# Patient Record
Sex: Male | Born: 2000 | Race: Black or African American | Hispanic: No | Marital: Single | Smoking: Current some day smoker
Health system: Southern US, Community
[De-identification: ages and names within clinical notes are randomized; demographics above are authoritative.]

---
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
## 2019-12-02 ENCOUNTER — Other Ambulatory Visit: Payer: Self-pay

## 2019-12-02 DIAGNOSIS — Z5321 Procedure and treatment not carried out due to patient leaving prior to being seen by health care provider: Secondary | ICD-10-CM | POA: Diagnosis not present

## 2019-12-02 DIAGNOSIS — G43909 Migraine, unspecified, not intractable, without status migrainosus: Secondary | ICD-10-CM | POA: Diagnosis present

## 2019-12-02 NOTE — ED Triage Notes (Addendum)
Patient complaining of migraine. Patient brought in by [facility] from home. Patient has no hx of migraines. Has had nausea no vomiting. Patient is ambulatory. Patient states he took ibuprofen around 9 pm.

## 2019-12-03 ENCOUNTER — Encounter (HOSPITAL_COMMUNITY): Payer: Self-pay | Admitting: Emergency Medicine

## 2019-12-03 ENCOUNTER — Emergency Department (HOSPITAL_COMMUNITY)
Admission: EM | Admit: 2019-12-03 | Discharge: 2019-12-03 | Discharge status: Against Medical Advice | Payer: Medicaid Other | Attending: Emergency Medicine | Admitting: Emergency Medicine

## 2019-12-05 ENCOUNTER — Other Ambulatory Visit: Payer: Self-pay

## 2019-12-05 ENCOUNTER — Emergency Department (HOSPITAL_COMMUNITY)
Admission: EM | Admit: 2019-12-05 | Discharge: 2019-12-05 | Disposition: A | Discharge status: Home / Self Care | Payer: Medicaid Other | Attending: Emergency Medicine | Admitting: Emergency Medicine

## 2019-12-05 ENCOUNTER — Ambulatory Visit (HOSPITAL_COMMUNITY)
Admission: EM | Admit: 2019-12-05 | Discharge: 2019-12-05 | Disposition: A | Discharge status: Home / Self Care | Payer: Self-pay

## 2019-12-05 ENCOUNTER — Encounter (HOSPITAL_COMMUNITY): Payer: Self-pay

## 2019-12-05 ENCOUNTER — Emergency Department (HOSPITAL_COMMUNITY): Payer: Medicaid Other

## 2019-12-05 DIAGNOSIS — Z7722 Contact with and (suspected) exposure to environmental tobacco smoke (acute) (chronic): Secondary | ICD-10-CM | POA: Insufficient documentation

## 2019-12-05 DIAGNOSIS — R519 Headache, unspecified: Secondary | ICD-10-CM | POA: Insufficient documentation

## 2019-12-05 DIAGNOSIS — R42 Dizziness and giddiness: Secondary | ICD-10-CM | POA: Diagnosis not present

## 2019-12-05 MED ORDER — ACETAMINOPHEN 500 MG PO TABS
1000.0000 mg | ORAL_TABLET | Freq: Once | ORAL | Status: AC
  Administered 2019-12-05: 1000 mg via ORAL
  Filled 2019-12-05: qty 2

## 2019-12-05 NOTE — ED Provider Notes (Signed)
[facility] EMERGENCY DEPARTMENT Provider Note   CSN: [id number] Arrival date & time: 12/05/19  1018     History No chief complaint on file.   Tony Vargas is a 18 y.o. male who is previously healthy who presents with a 6-day history of headache.  Patient reports he started with a normal type headache 6 days ago and since then he has had intermittent episodes of severe headaches that come on suddenly.  He reports most the time when he bends over.  He has had some dizziness at times that he describes as room spinning.  He is also had some photophobia.  He denies any nausea or vomiting, numbness or tingling, or weakness.  He is taking ibuprofen at home once.  He denies any neck pain or fever.  HPI     History reviewed. No pertinent past medical history.  There are no problems to display for this patient.   History reviewed. No pertinent surgical history.     No family history on file.  Social History   Tobacco Use  . Smoking status: Passive Smoke Exposure - Never Smoker  . Smokeless tobacco: Never Used  Substance Use Topics  . Alcohol use: Not Currently  . Drug use: Not Currently    Home Medications Prior to Admission medications   Medication Sig Start Date End Date Taking? Authorizing Provider  ibuprofen (ADVIL,MOTRIN) 600 MG tablet Take 1 tablet (600 mg total) by mouth every 6 (six) hours as needed for mild pain or moderate pain. 05/06/17   Benjamine Sprague, NP    Allergies    Grapeseed extract [nutritional supplements]  Review of Systems   Review of Systems  Constitutional: Negative for chills and fever.  HENT: Negative for facial swelling and sore throat.   Eyes: Positive for photophobia. Negative for visual disturbance.  Respiratory: Negative for shortness of breath.   Cardiovascular: Negative for chest pain.  Gastrointestinal: Negative for abdominal pain, diarrhea, nausea and vomiting.  Genitourinary: Negative for dysuria.   Musculoskeletal: Negative for back pain and neck pain.  Skin: Negative for rash and wound.  Neurological: Positive for dizziness and headaches. Negative for weakness and numbness.  Psychiatric/Behavioral: The patient is not nervous/anxious.     Physical Exam Updated Vital Signs BP (!) 146/90 (BP Location: Right Arm)   Pulse 90   Temp 97.8 F (36.6 C) (Oral)   Resp 20   SpO2 99%   Physical Exam Vitals and nursing note reviewed.  Constitutional:      General: He is not in acute distress.    Appearance: He is well-developed. He is not diaphoretic.  HENT:     Head: Normocephalic and atraumatic.     Mouth/Throat:     Pharynx: No oropharyngeal exudate.  Eyes:     General: No scleral icterus.       Right eye: No discharge.        Left eye: No discharge.     Extraocular Movements: Extraocular movements intact.     Conjunctiva/sclera: Conjunctivae normal.     Pupils: Pupils are equal, round, and reactive to light.  Neck:     Thyroid: No thyromegaly.  Cardiovascular:     Rate and Rhythm: Normal rate and regular rhythm.     Heart sounds: Normal heart sounds. No murmur. No friction rub. No gallop.   Pulmonary:     Effort: Pulmonary effort is normal. No respiratory distress.     Breath sounds: Normal breath sounds. No stridor. No  wheezing or rales.  Abdominal:     General: Bowel sounds are normal. There is no distension.     Palpations: Abdomen is soft.     Tenderness: There is no abdominal tenderness. There is no guarding or rebound.  Musculoskeletal:     Cervical back: Full passive range of motion without pain, normal range of motion and neck supple. No spinous process tenderness or muscular tenderness.  Lymphadenopathy:     Cervical: No cervical adenopathy.  Skin:    General: Skin is warm and dry.     Coloration: Skin is not pale.     Findings: No rash.  Neurological:     Mental Status: He is alert.     Coordination: Coordination normal.     Comments: CN 3-12 intact;  normal sensation throughout; 5/5 strength in all 4 extremities; equal bilateral grip strength     ED Results / Procedures / Treatments   Labs (all labs ordered are listed, but only abnormal results are displayed) Labs Reviewed - No data to display  EKG None  Radiology CT Head Wo Contrast  Result Date: 12/05/2019 CLINICAL DATA:  Per pt and mother, pt c/o "severe HA" and had syncopal episode. Pt alert, appropriate in conversation, ambulating without difficulty. Pt seen at [facility] on 12/09 for the same EXAM: CT HEAD WITHOUT CONTRAST TECHNIQUE: Contiguous axial images were obtained from the base of the skull through the vertex without intravenous contrast. COMPARISON:  None. FINDINGS: Brain: No evidence of acute infarction, hemorrhage, hydrocephalus, extra-axial collection or mass lesion/mass effect. Vascular: No hyperdense vessel or unexpected calcification. Skull: Normal. Negative for fracture or focal lesion. Sinuses/Orbits: The right frontal sinus is opacified. The remaining sinuses are clear. Normal appearance of the orbits. Other: None. IMPRESSION: 1. No acute intracranial abnormality. 2. Right frontal sinus disease. Electronically Signed   By: Emmaline Kluver M.D.   On: 12/05/2019 12:32    Procedures Procedures (including critical care time)  Medications Ordered in ED Medications  acetaminophen (TYLENOL) tablet 1,000 mg (1,000 mg Oral Given 12/05/19 1256)    ED Course  I have reviewed the triage vital signs and the nursing notes.  Pertinent labs & imaging results that were available during my care of the patient were reviewed by me and considered in my medical decision making (see chart for details).    MDM Rules/Calculators/A&P  Pt HA treated and improved while in ED. given new headache and reports of sudden headache, CT head was ordered which was negative except for right frontal sinus disease.  Patient denies any history of sinus problems.  He denies pain in this area.  Pt is  afebrile with no focal neuro deficits, nuchal rigidity, or change in vision.  Patient is very comfortable appearing and denies any severe headache right now.  Pt is to follow up with PCP or neurology for further evaluation. Pt verbalizes understanding and is agreeable with plan to dc. I discussed patient case with Dr. Denton Lank who guided the patient's management and agrees with plan.    Final Clinical Impression(s) / ED Diagnoses Final diagnoses:  Bad headache    Rx / DC Orders ED Discharge Orders    None       Emi Holes, PA-C 12/05/19 1300    Cathren Laine, MD 12/06/19 [phone]

## 2019-12-05 NOTE — ED Triage Notes (Signed)
Per pt and mother, pt c/o "severe HA" and had syncopal episode.  Pt alert, appropriate in conversation, ambulating without difficulty.  Discussed with Rondel Oh, NP.  Instructed pt and mother to be seen in ED based on sxs per provider recommendation.  Pt and mother verbalized understanding.

## 2019-12-05 NOTE — Discharge Instructions (Signed)
Alternate ibuprofen and Tylenol as prescribed over-the-counter, as needed for headache.  Make sure to stay well-hydrated.  Try to avoid excessive caffeine.  Make sure to get plenty of sleep.  Please follow-up with your primary care provider or neurologist below for further evaluation and treatment of your headaches.  I recommend beginning a headache journal.

## 2019-12-05 NOTE — ED Triage Notes (Signed)
Patient complains of generalized headache for the past week. Denies any trauma, no associated symptoms. Alert and oriented

## 2019-12-25 ENCOUNTER — Emergency Department (HOSPITAL_COMMUNITY): Payer: Medicaid Other

## 2019-12-25 ENCOUNTER — Emergency Department (HOSPITAL_COMMUNITY)
Admission: EM | Admit: 2019-12-25 | Discharge: 2019-12-25 | Disposition: A | Discharge status: Home / Self Care | Payer: Medicaid Other | Attending: Emergency Medicine | Admitting: Emergency Medicine

## 2019-12-25 DIAGNOSIS — Z20822 Contact with and (suspected) exposure to covid-19: Secondary | ICD-10-CM | POA: Diagnosis not present

## 2019-12-25 DIAGNOSIS — R0789 Other chest pain: Secondary | ICD-10-CM | POA: Diagnosis not present

## 2019-12-25 DIAGNOSIS — R519 Headache, unspecified: Secondary | ICD-10-CM | POA: Insufficient documentation

## 2019-12-25 DIAGNOSIS — Z7722 Contact with and (suspected) exposure to environmental tobacco smoke (acute) (chronic): Secondary | ICD-10-CM | POA: Diagnosis not present

## 2019-12-25 DIAGNOSIS — Z79899 Other long term (current) drug therapy: Secondary | ICD-10-CM | POA: Insufficient documentation

## 2019-12-25 DIAGNOSIS — R1032 Left lower quadrant pain: Secondary | ICD-10-CM | POA: Diagnosis not present

## 2019-12-25 DIAGNOSIS — R109 Unspecified abdominal pain: Secondary | ICD-10-CM

## 2019-12-25 DIAGNOSIS — R935 Abnormal findings on diagnostic imaging of other abdominal regions, including retroperitoneum: Secondary | ICD-10-CM | POA: Diagnosis not present

## 2019-12-25 DIAGNOSIS — R1012 Left upper quadrant pain: Secondary | ICD-10-CM | POA: Diagnosis not present

## 2019-12-25 DIAGNOSIS — R0781 Pleurodynia: Secondary | ICD-10-CM | POA: Diagnosis not present

## 2019-12-25 LAB — URINALYSIS, ROUTINE W REFLEX MICROSCOPIC
Glucose, UA: NEGATIVE mg/dL
Hgb urine dipstick: NEGATIVE
Ketones, ur: 20 mg/dL — AB
Leukocytes,Ua: NEGATIVE
Nitrite: NEGATIVE
Protein, ur: 100 mg/dL — AB
Specific Gravity, Urine: 1.038 — ABNORMAL HIGH (ref 1.005–1.030)
pH: 6 (ref 5.0–8.0)

## 2019-12-25 LAB — COMPREHENSIVE METABOLIC PANEL
ALT: 27 U/L (ref 0–44)
AST: 37 U/L (ref 15–41)
Albumin: 4.1 g/dL (ref 3.5–5.0)
Alkaline Phosphatase: 70 U/L (ref 38–126)
Anion gap: 10 (ref 5–15)
BUN: 10 mg/dL (ref 6–20)
CO2: 26 mmol/L (ref 22–32)
Calcium: 8.9 mg/dL (ref 8.9–10.3)
Chloride: 104 mmol/L (ref 98–111)
Creatinine, Ser: 1.02 mg/dL (ref 0.61–1.24)
GFR calc Af Amer: >60 mL/min (ref >60)
GFR calc non Af Amer: >60 mL/min (ref >60)
Glucose, Bld: 86 mg/dL (ref 70–99)
Potassium: 4 mmol/L (ref 3.5–5.1)
Sodium: 140 mmol/L (ref 135–145)
Total Bilirubin: 1.9 mg/dL — ABNORMAL HIGH (ref 0.3–1.2)
Total Protein: 7.3 g/dL (ref 6.5–8.1)

## 2019-12-25 LAB — CBC WITH DIFFERENTIAL/PLATELET
Abs Immature Granulocytes: 0 10*3/uL (ref 0.00–0.07)
Basophils Absolute: 0 10*3/uL (ref 0.0–0.1)
Basophils Relative: 0 %
Eosinophils Absolute: 0 10*3/uL (ref 0.0–0.5)
Eosinophils Relative: 0 %
HCT: 48.8 % (ref 39.0–52.0)
Hemoglobin: 16.3 g/dL (ref 13.0–17.0)
Lymphocytes Relative: 52 %
Lymphs Abs: 3.1 10*3/uL (ref 0.7–4.0)
MCH: 28.5 pg (ref 26.0–34.0)
MCHC: 33.4 g/dL (ref 30.0–36.0)
MCV: 85.3 fL (ref 80.0–100.0)
Monocytes Absolute: 0.7 10*3/uL (ref 0.1–1.0)
Monocytes Relative: 11 %
Neutro Abs: 2.2 10*3/uL (ref 1.7–7.7)
Neutrophils Relative %: 37 %
Platelets: 141 10*3/uL — ABNORMAL LOW (ref 150–400)
RBC: 5.72 MIL/uL (ref 4.22–5.81)
RDW: 12.5 % (ref 11.5–15.5)
WBC: 6 10*3/uL (ref 4.0–10.5)
nRBC: 0 % (ref 0.0–0.2)

## 2019-12-25 LAB — D-DIMER, QUANTITATIVE: D-Dimer, Quant: 0.41 ug/mL-FEU (ref 0.00–0.50)

## 2019-12-25 LAB — LIPASE, BLOOD: Lipase: 20 U/L (ref 11–51)

## 2019-12-25 LAB — POC SARS CORONAVIRUS 2 AG -  ED: SARS Coronavirus 2 Ag: NEGATIVE

## 2019-12-25 MED ORDER — SODIUM CHLORIDE 0.9 % IV BOLUS
1000.0000 mL | Freq: Once | INTRAVENOUS | Status: AC
  Administered 2019-12-25: 1000 mL via INTRAVENOUS

## 2019-12-25 MED ORDER — NAPROXEN 500 MG PO TABS: 500.0000 mg | ORAL_TABLET | Freq: Two times a day (BID) | ORAL | 0 refills | Status: AC

## 2019-12-25 MED ORDER — MORPHINE SULFATE (PF) 4 MG/ML IV SOLN
4.0000 mg | Freq: Once | INTRAVENOUS | Status: AC
  Administered 2019-12-25: 4 mg via INTRAVENOUS
  Filled 2019-12-25: qty 1

## 2019-12-25 MED ORDER — KETOROLAC TROMETHAMINE 30 MG/ML IJ SOLN
30.0000 mg | Freq: Once | INTRAMUSCULAR | Status: AC
  Administered 2019-12-25: 30 mg via INTRAVENOUS
  Filled 2019-12-25: qty 1

## 2019-12-25 MED ORDER — IOHEXOL 300 MG/ML  SOLN
100.0000 mL | Freq: Once | INTRAMUSCULAR | Status: AC | PRN
  Administered 2019-12-25: 100 mL via INTRAVENOUS

## 2019-12-25 MED ORDER — ACETAMINOPHEN 500 MG PO TABS
1000.0000 mg | ORAL_TABLET | Freq: Once | ORAL | Status: AC
  Administered 2019-12-25: 1000 mg via ORAL
  Filled 2019-12-25: qty 2

## 2019-12-25 NOTE — ED Notes (Signed)
Pt made aware of the need for urine.

## 2019-12-25 NOTE — ED Provider Notes (Signed)
[facility] EMERGENCY DEPARTMENT Provider Note   CSN: [id number] Arrival date & time: 12/25/19  1234     History Chief Complaint  Patient presents with  . Headache    Tony Vargas is a 19 y.o. male.  HPI   Pt is a 19 y/o male who presents to the ED today for evaluation of multiple complaints including headache and left abd pain/left lower rib pain.  Headache: He reports intermittent headaches for the last 3 weeks. He states that this AM he woke up and had a headache. HA is located diffusely. Pain feels like an aching/throbbing sensation. When he had the headache he rated it 8-9/10. The headache lasted for 3-4 minutes. He states that typically that is how long the episodes last. He does not have a headache currently.  Denies any vision changes, lightheadedness, dizziness, numbness/weakness.  Left abdominal pain/left lower rib pain: Patient states that for the last week he has had persistent left upper abdominal pain and left lower rib pain.  Pain is worse with breathing. He denies NVD, constipation, urinary sxs. No recent surgeries or hospital admission. No h/o cvt/pe or ca. No cough or hemoptysis. Denies prior diagnosis of COVID or known COVID exposures this year.   Denies associated rhinorrhea, congestion, sore throat, cough, shortness of breath. He also reports that he had body aches a few days ago that have resolved. He reports that he has had decreased appetite for the last few days. He has not really been eating anything. Yesterday all he ate was half of a slice of pizza and today all he ate was hashbrowns.  He has tried tylenol without relief of his sxs.  He was seen in the ED earlier this month for the headache. Records reviewed. At that time pt had reassuring w/u with neg CT head.   No past medical history on file.  There are no problems to display for this patient.  No past surgical history on file.    No family history on file.  Social History    Tobacco Use  . Smoking status: Passive Smoke Exposure - Never Smoker  . Smokeless tobacco: Never Used  Substance Use Topics  . Alcohol use: Not Currently  . Drug use: Not Currently    Home Medications Prior to Admission medications   Medication Sig Start Date End Date Taking? Authorizing Provider  ibuprofen (ADVIL,MOTRIN) 600 MG tablet Take 1 tablet (600 mg total) by mouth every 6 (six) hours as needed for mild pain or moderate pain. 05/06/17   Ronnell Freshwater, NP  naproxen (NAPROSYN) 500 MG tablet Take 1 tablet (500 mg total) by mouth 2 (two) times daily for 5 days. 12/25/19 12/30/19  Lowen Mansouri S, PA-C    Allergies    Grapeseed extract [nutritional supplements]  Review of Systems   Review of Systems  Constitutional: Negative for chills and fever.  HENT: Negative for ear pain and sore throat.   Eyes: Negative for visual disturbance.  Respiratory: Negative for cough and shortness of breath.   Cardiovascular: Positive for chest pain.  Gastrointestinal: Positive for abdominal pain. Negative for constipation, diarrhea, nausea and vomiting.  Genitourinary: Negative for dysuria and hematuria.  Musculoskeletal: Positive for myalgias.  Skin: Negative for rash.  Neurological: Positive for headaches. Negative for dizziness, weakness, light-headedness and numbness.  All other systems reviewed and are negative.   Physical Exam Updated Vital Signs BP (!) 154/94 (BP Location: Right Arm)   Pulse 87   Temp 98.1 F (  36.7 C)   Resp 16   SpO2 100%   Physical Exam Vitals and nursing note reviewed.  Constitutional:      Appearance: He is well-developed.  HENT:     Head: Normocephalic and atraumatic.  Eyes:     Conjunctiva/sclera: Conjunctivae normal.  Cardiovascular:     Rate and Rhythm: Normal rate and regular rhythm.     Heart sounds: No murmur.  Pulmonary:     Effort: Pulmonary effort is normal. No respiratory distress.     Breath sounds: Normal breath sounds.   Abdominal:     General: Bowel sounds are normal. There is no distension.     Palpations: Abdomen is soft.     Tenderness: There is abdominal tenderness (mild luq). There is no guarding.  Musculoskeletal:     Cervical back: Neck supple.  Skin:    General: Skin is warm and dry.  Neurological:     Mental Status: He is alert.     Comments: Mental Status:  Alert, thought content appropriate, able to give a coherent history. Speech fluent without evidence of aphasia. Able to follow 2 step commands without difficulty.  Cranial Nerves:  II:  pupils equal, round, reactive to light III,IV, VI: ptosis not present, extra-ocular motions intact bilaterally  V,VII: smile symmetric, facial light touch sensation equal VIII: hearing grossly normal to voice  X: uvula elevates symmetrically  XI: bilateral shoulder shrug symmetric and strong XII: midline tongue extension without fassiculations Motor:  Normal tone. 5/5 strength of BUE and BLE major muscle groups including strong and equal grip strength and dorsiflexion/plantar flexion Sensory: light touch normal in all extremities.     ED Results / Procedures / Treatments   Labs (all labs ordered are listed, but only abnormal results are displayed) Labs Reviewed  CBC WITH DIFFERENTIAL/PLATELET - Abnormal; Notable for the following components:      Result Value   Platelets 141 (*)    All other components within normal limits  COMPREHENSIVE METABOLIC PANEL - Abnormal; Notable for the following components:   Total Bilirubin 1.9 (*)    All other components within normal limits  URINALYSIS, ROUTINE W REFLEX MICROSCOPIC - Abnormal; Notable for the following components:   Color, Urine AMBER (*)    Specific Gravity, Urine 1.038 (*)    Bilirubin Urine SMALL (*)    Ketones, ur 20 (*)    Protein, ur 100 (*)    Bacteria, UA RARE (*)    All other components within normal limits  NOVEL CORONAVIRUS, NAA (HOSP ORDER, SEND-OUT TO REF LAB; TAT 18-24 HRS)   LIPASE, BLOOD  D-DIMER, QUANTITATIVE (NOT AT [facility])  POC SARS CORONAVIRUS 2 AG -  ED    EKG None  Radiology CT ABDOMEN PELVIS W CONTRAST  Result Date: 12/25/2019 CLINICAL DATA:  Pt returning to ER for persistent intermittent headaches, reports no pin-point location of the headache, all over. EXAM: CT ABDOMEN AND PELVIS WITH CONTRAST TECHNIQUE: Multidetector CT imaging of the abdomen and pelvis was performed using the standard protocol following bolus administration of intravenous contrast. CONTRAST:  OMNIPAQUE IOHEXOL 300 MG/ML  SOLN COMPARISON:  None. FINDINGS: Lower chest: No acute abnormality. Hepatobiliary: No focal liver abnormality is seen. No gallstones, gallbladder wall thickening, or biliary dilatation. Pancreas: Unremarkable. No pancreatic ductal dilatation or surrounding inflammatory changes. Spleen: Normal in size without focal abnormality. Adrenals/Urinary Tract: Adrenal glands are unremarkable. Kidneys are normal, without renal calculi, focal lesion, or hydronephrosis. Bladder is unremarkable. Stomach/Bowel: Stomach is within normal limits.  Evaluation of the bowel and appendix is limited by the lack of intra-abdominal fat and PO contrast. No evidence of bowel obstruction. Vascular/Lymphatic: No significant vascular findings are present. No definite lymphadenopathy Reproductive: Prostate is unremarkable. Other: No abdominal wall hernia or abnormality. No abdominopelvic ascites. Musculoskeletal: No acute or significant osseous findings. IMPRESSION: 1. No evidence of acute intra-abdominal pathology. 2. Evaluation of the bowel and appendix is limited by the lack of intra-abdominal fat and PO contrast. However no definite abnormality is seen. Electronically Signed   By: Emmaline Kluver M.D.   On: 12/25/2019 18:11   DG Chest Portable 1 View  Result Date: 12/25/2019 CLINICAL DATA:  Left sided rib pain, no trauma EXAM: PORTABLE CHEST 1 VIEW COMPARISON:  None. FINDINGS: The heart size and  mediastinal contours are within normal limits. The lungs are clear. No pneumothorax or large pleural effusion. The visualized skeletal structures are unremarkable. No evidence of a displaced rib fracture or destructive bony lesion in visualized portions of the left ribs. IMPRESSION: No displaced rib fracture or destructive bony lesion in the visualized portions of the left ribs. Electronically Signed   By: Emmaline Kluver M.D.   On: 12/25/2019 14:03    Procedures Procedures (including critical care time)  Medications Ordered in ED Medications  acetaminophen (TYLENOL) tablet 1,000 mg (1,000 mg Oral Given 12/25/19 1418)  sodium chloride 0.9 % bolus 1,000 mL (0 mLs Intravenous Stopped 12/25/19 1732)  ketorolac (TORADOL) 30 MG/ML injection 30 mg (30 mg Intravenous Given 12/25/19 1418)  morphine 4 MG/ML injection 4 mg (4 mg Intravenous Given 12/25/19 1731)  iohexol (OMNIPAQUE) 300 MG/ML solution 100 mL (100 mLs Intravenous Contrast Given 12/25/19 1803)    ED Course  I have reviewed the triage vital signs and the nursing notes.  Pertinent labs & imaging results that were available during my care of the patient were reviewed by me and considered in my medical decision making (see chart for details).    MDM Rules/Calculators/A&P                      19 y/o male presenting with multiple complaints including persistent headaches for the last 3 weeks, left-sided abdominal pain/left lower chest pain.  With regard to headaches, patient had head CT earlier this week that was negative for any acute findings.  At this time he does not actually have a headache.  He has no associated neuro complaints and his neurologic exam today is normal.  I have low suspicion for emergent cause of his symptoms at this time and do feel that he can continue the remainder of this work-up as an outpatient.  We will give ambulatory referral to neurology.  With regard to left sided abdominal pain/left lower chest pain, D-dimer was  negative therefore PE less likely.  Chest x-ray negative.  Abdominal labs are reassuring.  No leukocytosis.  Normal LFTs and kidney function.  Normal lipase.  UA negative.  Lower suspicion for gastritis/PUD given symptoms not associated with eating and really only occur with breathing, coughing, and movement.  Will start an anti-inflammatory and have him follow-up with PCP.  He was given referral for this.  Patient was also tested for the coronavirus.  Point-of-care testing was negative.  Send out testing was obtained and pending at the time of discharge.  Patient advised on quarantine measures.  Advised on specific return precautions.  He voices understanding of the plan and reasons to return.  All questions answered.  Patient stable for  discharge.  ----  Tony Vargas was evaluated in Emergency Department on 12/25/2019 for the symptoms described in the history of present illness. He was evaluated in the context of the global COVID-19 pandemic, which necessitated consideration that the patient might be at risk for infection with the SARS-CoV-2 virus that causes COVID-19. Institutional protocols and algorithms that pertain to the evaluation of patients at risk for COVID-19 are in a state of rapid change based on information released by regulatory bodies including the CDC and federal and state organizations. These policies and algorithms were followed during the patient's care in the ED.   Final Clinical Impression(s) / ED Diagnoses Final diagnoses:  Headache disorder  Left sided abdominal pain  Atypical chest pain  Person under investigation for COVID-19    Rx / DC Orders ED Discharge Orders         Ordered    naproxen (NAPROSYN) 500 MG tablet  2 times daily     12/25/19 1841           Rodney Booze, PA-C 12/25/19 1904    Lucrezia Starch, MD 12/31/19 1457

## 2019-12-25 NOTE — ED Notes (Signed)
Patient transported to CT 

## 2019-12-25 NOTE — Discharge Instructions (Addendum)
Please take medications as directed.  You were tested for the coronavirus today.  The results will be available in the next 2 to 4 days.  Please quarantine until you are aware of the results.  If the results are positive you will need to continue quarantine for 14 days.  You were given a referral to a neurologist office.  They will contact you to set up an appointment.  You are also given information of follow-up with a primary care doctor.  Please call the office to set up an appointment for follow-up to further evaluate and address your symptoms.  Monitor symptoms closely, please return the emergency department for any new or worsening symptoms.

## 2019-12-25 NOTE — ED Notes (Signed)
Spoke to mother to update her. She would like a call from the PA when the results are in.

## 2019-12-25 NOTE — ED Triage Notes (Signed)
Pt returning to ER for persistent intermittent headaches, reports no pin-point location of the headache, all over. Denies illness- cough, nausea, diarrhea, congestion, etc. Reports was seen 3 weeks ago for same. Also reports his whole body hurts at times. NAD in triage. He is a/o x4.4

## 2019-12-25 NOTE — ED Notes (Signed)
Urine Culture sent to Main Lab with UA 

## 2019-12-26 LAB — NOVEL CORONAVIRUS, NAA (HOSP ORDER, SEND-OUT TO REF LAB; TAT 18-24 HRS): SARS-CoV-2, NAA: NOT DETECTED

## 2020-01-01 ENCOUNTER — Encounter (HOSPITAL_COMMUNITY): Payer: Self-pay

## 2020-01-01 ENCOUNTER — Ambulatory Visit (HOSPITAL_COMMUNITY)
Admission: EM | Admit: 2020-01-01 | Discharge: 2020-01-01 | Disposition: A | Discharge status: Home / Self Care | Payer: Medicaid Other | Attending: Family Medicine | Admitting: Family Medicine

## 2020-01-01 ENCOUNTER — Other Ambulatory Visit: Payer: Self-pay

## 2020-01-01 DIAGNOSIS — B2790 Infectious mononucleosis, unspecified without complication: Secondary | ICD-10-CM | POA: Diagnosis not present

## 2020-01-01 LAB — POCT INFECTIOUS MONO SCREEN: Mono Screen: POSITIVE — AB

## 2020-01-01 LAB — POCT RAPID STREP A: Streptococcus, Group A Screen (Direct): NEGATIVE

## 2020-01-01 MED ORDER — DEXAMETHASONE SODIUM PHOSPHATE 10 MG/ML IJ SOLN
10.0000 mg | Freq: Once | INTRAMUSCULAR | Status: AC
  Administered 2020-01-01: 10 mg

## 2020-01-01 MED ORDER — DEXAMETHASONE SODIUM PHOSPHATE 10 MG/ML IJ SOLN
INTRAMUSCULAR | Status: AC
  Filled 2020-01-01: qty 1

## 2020-01-01 NOTE — Discharge Instructions (Addendum)
Your mono is negative today, which is consistent with the source of your symptoms.  This is a viral illness, meaning there is no specific medication to cure it.  Push fluids to ensure adequate hydration and keep secretions thin.  Tylenol and/or ibuprofen as needed for pain or fevers.  Throat lozenges, gargles, chloraseptic spray, warm teas, popsicles etc to help with throat pain.   Most important is avoid contact sports or any risk of trauma to your abdomen such as with falls- avoid ladders, skiing etc.  Please follow up with your primary care provider for recheck in the next few weeks, especially if no improvement.  If any worsening of symptoms, specifically any increased abdominal pain, please go to the ER.  If difficulty swallowing or breathing please go to the ER.  This is contagious so avoid sharing utensils, beverages or kissing others.

## 2020-01-01 NOTE — ED Triage Notes (Addendum)
Pt. States he has had a sore throat for a week & was COVID tested on 12/25/2019, states he does NOT want COVID testing.

## 2020-01-01 NOTE — ED Provider Notes (Signed)
[facility]    CSN: [id number] Arrival date & time: 01/01/20  0915      History   Chief Complaint Chief Complaint  Patient presents with  . Sore Throat    HPI Tony Vargas is a 19 y.o. male.   Tony Vargas presents with complaints of sore throat. Headache. Started 1.5 week ago. No worse, no better. No fevers no chills. No cough, no runny nose. Has had issues with headaches for some time, has been referred to neurology for this. Has been taking naproxen for headache, doesn't help with sore throat. Theraflu, helps some with sore throat. Pain with eating. Eating soft foods. No known ill contacts. No previous mono. Was seen in ER on 1/1, negative covid testing at that time as well as negative abdominal CT. He did have complaints of LUQ pain at that time. This has not worsened. He works as a Nutritional therapist.    ROS per HPI, negative if not otherwise mentioned.      History reviewed. No pertinent past medical history.  There are no problems to display for this patient.   History reviewed. No pertinent surgical history.     Home Medications    Prior to Admission medications   Medication Sig Start Date End Date Taking? Authorizing Provider  ibuprofen (ADVIL,MOTRIN) 600 MG tablet Take 1 tablet (600 mg total) by mouth every 6 (six) hours as needed for mild pain or moderate pain. 05/06/17   Ronnell Freshwater, NP    Family History Family History  Problem Relation Age of Onset  . Healthy Mother   . Healthy Father     Social History Social History   Tobacco Use  . Smoking status: Passive Smoke Exposure - Never Smoker  . Smokeless tobacco: Never Used  Substance Use Topics  . Alcohol use: Yes  . Drug use: Not Currently     Allergies   Grapeseed extract [nutritional supplements]   Review of Systems Review of Systems   Physical Exam Triage Vital Signs ED Triage Vitals  Enc Vitals Group     BP 01/01/20 0944 136/83     Pulse Rate 01/01/20  0944 85     Resp 01/01/20 0944 17     Temp 01/01/20 0944 98.1 F (36.7 C)     Temp Source 01/01/20 0944 Oral     SpO2 01/01/20 0944 98 %     Weight --      Height --      Head Circumference --      Peak Flow --      Pain Score 01/01/20 0943 8     Pain Loc --      Pain Edu? --      Excl. in GC? --    No data found.  Updated Vital Signs BP 136/83 (BP Location: Right Arm)   Pulse 85   Temp 98.1 F (36.7 C) (Oral)   Resp 17   SpO2 98%   Visual Acuity Right Eye Distance:   Left Eye Distance:   Bilateral Distance:    Right Eye Near:   Left Eye Near:    Bilateral Near:     Physical Exam Constitutional:      Appearance: He is well-developed.  HENT:     Mouth/Throat:     Pharynx: Posterior oropharyngeal erythema present. No uvula swelling.     Tonsils: Tonsillar exudate present. 2+ on the right. 2+ on the left.     Comments: Uvula midline; noted swelling  to tonsils bilaterally as well as mild soft palate swelling with palatal petechia noted  Cardiovascular:     Rate and Rhythm: Normal rate.  Pulmonary:     Effort: Pulmonary effort is normal.  Abdominal:     Tenderness: There is no abdominal tenderness.     Comments: On obvious palpable splenomegaly on exam   Lymphadenopathy:     Cervical: Cervical adenopathy present.  Skin:    General: Skin is warm and dry.  Neurological:     Mental Status: He is alert and oriented to person, place, and time.      UC Treatments / Results  Labs (all labs ordered are listed, but only abnormal results are displayed) Labs Reviewed  POCT INFECTIOUS MONO SCREEN - Abnormal; Notable for the following components:      Result Value   Mono Screen POSITIVE (*)    All other components within normal limits  CULTURE, GROUP A STREP [facility])  POCT RAPID STREP A  POCT INFECTIOUS MONO SCREEN    EKG   Radiology No results found.  Procedures Procedures (including critical care time)  Medications Ordered in UC Medications    dexamethasone (DECADRON) injection 10 mg (10 mg Other Given 01/01/20 1043)    Initial Impression / Assessment and Plan / UC Course  I have reviewed the triage vital signs and the nursing notes.  Pertinent labs & imaging results that were available during my care of the patient were reviewed by me and considered in my medical decision making (see chart for details).     Negative rapid strep here today. Negative covid 1/1, negative abdominal ct 1/1 and labs were reassuring at that time. No worsening of abdominal pain. Taking oral intake. Vitals stable here today, swallowing without apparent difficulty. No palpable abdominal pain. Positive rapid mono here today. Supportive cares recommended. Abdominal precautions recommended. Decadron provided for oropharynx swelling. Encouraged follow up with PCP for recheck. Return precautions provided. Patient verbalized understanding and agreeable to plan.   Final Clinical Impressions(s) / UC Diagnoses   Final diagnoses:  Infectious mononucleosis without complication, infectious mononucleosis due to unspecified organism     Discharge Instructions     Your mono is negative today, which is consistent with the source of your symptoms.  This is a viral illness, meaning there is no specific medication to cure it.  Push fluids to ensure adequate hydration and keep secretions thin.  Tylenol and/or ibuprofen as needed for pain or fevers.  Throat lozenges, gargles, chloraseptic spray, warm teas, popsicles etc to help with throat pain.   Most important is avoid contact sports or any risk of trauma to your abdomen such as with falls- avoid ladders, skiing etc.  Please follow up with your primary care provider for recheck in the next few weeks, especially if no improvement.  If any worsening of symptoms, specifically any increased abdominal pain, please go to the ER.  If difficulty swallowing or breathing please go to the ER.  This is contagious so avoid sharing  utensils, beverages or kissing others.     ED Prescriptions    None     PDMP not reviewed this encounter.   Zigmund Gottron, NP 01/01/20 1055

## 2020-01-03 LAB — CULTURE, GROUP A STREP (THRC)

## 2020-01-06 ENCOUNTER — Encounter (HOSPITAL_COMMUNITY): Payer: Self-pay

## 2020-01-06 ENCOUNTER — Ambulatory Visit (HOSPITAL_COMMUNITY)
Admission: EM | Admit: 2020-01-06 | Discharge: 2020-01-06 | Disposition: A | Discharge status: Home / Self Care | Payer: Medicaid Other | Attending: Emergency Medicine | Admitting: Emergency Medicine

## 2020-01-06 ENCOUNTER — Other Ambulatory Visit: Payer: Self-pay

## 2020-01-06 ENCOUNTER — Ambulatory Visit
Admission: EM | Admit: 2020-01-06 | Discharge: 2020-01-06 | Disposition: A | Discharge status: Home / Self Care | Payer: Medicaid Other

## 2020-01-06 DIAGNOSIS — L02211 Cutaneous abscess of abdominal wall: Secondary | ICD-10-CM

## 2020-01-06 MED ORDER — DOXYCYCLINE HYCLATE 100 MG PO CAPS: 100.0000 mg | ORAL_CAPSULE | Freq: Two times a day (BID) | ORAL | 0 refills | Status: AC

## 2020-01-06 MED ORDER — IBUPROFEN 800 MG PO TABS: 800.0000 mg | ORAL_TABLET | Freq: Three times a day (TID) | ORAL | 0 refills | Status: DC

## 2020-01-06 NOTE — Discharge Instructions (Signed)
Please begin doxycycline for 10 days ° °Apply warm compresses/hot rags to area with massage to express further drainage especially the first 24-48 hours ° °Use anti-inflammatories for pain/swelling. You may take up to 800 mg Ibuprofen every 8 hours with food. You may supplement Ibuprofen with Tylenol 500-1000 mg every 8 hours.  ° °Return if symptoms returning or not improving ° °

## 2020-01-06 NOTE — ED Provider Notes (Signed)
[place]    CSN: [id number] Arrival date & time: 01/06/20  1884      History   Chief Complaint Chief Complaint  Patient presents with  . Abscess    HPI Tony Vargas is a 19 y.o. male no significant past medical history presenting today for evaluation of an abscess.  Patient has developed 2 abscesses over the past week.  Has noted some intermittent draining.  Areas have been associated with significant pain.  Notes that he does shave his stomach regularly.  He denies any fevers or chills.  Does note he felt slightly nauseous with 2 episodes of vomiting yesterday, but is unsure what this was related to.  Has been applying Neosporin to areas.  Denies history of prior abscesses.  HPI  History reviewed. No pertinent past medical history.  There are no problems to display for this patient.   History reviewed. No pertinent surgical history.     Home Medications    Prior to Admission medications   Medication Sig Start Date End Date Taking? Authorizing Provider  doxycycline (VIBRAMYCIN) 100 MG capsule Take 1 capsule (100 mg total) by mouth 2 (two) times daily for 10 days. 01/06/20 01/16/20  Carolena Fairbank C, PA-C  ibuprofen (ADVIL) 800 MG tablet Take 1 tablet (800 mg total) by mouth 3 (three) times daily. 01/06/20   Ceria Suminski, Elesa Hacker, PA-C    Family History Family History  Problem Relation Age of Onset  . Healthy Mother   . Healthy Father     Social History Social History   Tobacco Use  . Smoking status: Passive Smoke Exposure - Never Smoker  . Smokeless tobacco: Never Used  Substance Use Topics  . Alcohol use: Yes  . Drug use: Not Currently     Allergies   Grapeseed extract [nutritional supplements]   Review of Systems Review of Systems  Constitutional: Negative for activity change, appetite change, chills, fatigue and fever.  HENT: Negative for congestion, ear pain, rhinorrhea, sinus pressure, sore throat and trouble swallowing.   Eyes:  Negative for discharge and redness.  Respiratory: Negative for cough, chest tightness and shortness of breath.   Cardiovascular: Negative for chest pain.  Gastrointestinal: Positive for vomiting. Negative for abdominal pain, diarrhea and nausea.  Musculoskeletal: Negative for myalgias.  Skin: Positive for color change and rash.  Neurological: Negative for dizziness, light-headedness and headaches.     Physical Exam Triage Vital Signs ED Triage Vitals  Enc Vitals Group     BP --      Pulse Rate 01/06/20 0833 92     Resp 01/06/20 0833 16     Temp 01/06/20 0833 98.1 F (36.7 C)     Temp Source 01/06/20 0833 Oral     SpO2 01/06/20 0833 100 %     Weight --      Height --      Head Circumference --      Peak Flow --      Pain Score 01/06/20 0828 10     Pain Loc --      Pain Edu? --      Excl. in [place]? --    No data found.  Updated Vital Signs Pulse 92   Temp 98.1 F (36.7 C) (Oral)   Resp 16   SpO2 100%   Visual Acuity Right Eye Distance:   Left Eye Distance:   Bilateral Distance:    Right Eye Near:   Left Eye Near:    Bilateral Near:  Physical Exam Vitals and nursing note reviewed.  Constitutional:      Appearance: He is well-developed.     Comments: No acute distress  HENT:     Head: Normocephalic and atraumatic.     Nose: Nose normal.  Eyes:     Conjunctiva/sclera: Conjunctivae normal.  Cardiovascular:     Rate and Rhythm: Normal rate.  Pulmonary:     Effort: Pulmonary effort is normal. No respiratory distress.  Abdominal:     General: There is no distension.     Comments: 2 areas of erythema and induration noted above and below umbilicus, lesion below without fluctuance, central area with scabbing Abscess superior to umbilicus induration extending approximately 3 cm from center, small scab centrally, minimal fluctuance  Musculoskeletal:        General: Normal range of motion.     Cervical back: Neck supple.  Skin:    General: Skin is warm and dry.    Neurological:     Mental Status: He is alert and oriented to person, place, and time.      UC Treatments / Results  Labs (all labs ordered are listed, but only abnormal results are displayed) Labs Reviewed - No data to display  EKG   Radiology No results found.  Procedures Incision and Drainage  Date/Time: 01/06/2020 10:46 AM Performed by: Danesha Kirchoff, Owen C, PA-C Authorized by: Linder Prajapati, Junius Creamer, PA-C   Consent:    Consent obtained:  Verbal   Consent given by:  Patient   Risks discussed:  Incomplete drainage, pain and bleeding   Alternatives discussed:  No treatment Location:    Type:  Abscess   Size:  3   Location:  Trunk   Trunk location:  Abdomen Pre-procedure details:    Skin preparation:  Betadine Anesthesia (see MAR for exact dosages):    Anesthesia method:  Local infiltration   Local anesthetic:  Lidocaine 2% w/o epi Procedure type:    Complexity:  Simple Procedure details:    Needle aspiration: no     Incision types:  Single straight   Incision depth:  Subcutaneous   Scalpel blade:  11   Wound management:  Probed and deloculated   Drainage:  Bloody   Drainage amount:  Scant   Wound treatment:  Wound left open   Packing materials:  None Post-procedure details:    Patient tolerance of procedure:  Tolerated well, no immediate complications   (including critical care time)  Medications Ordered in UC Medications - No data to display  Initial Impression / Assessment and Plan / UC Course  I have reviewed the triage vital signs and the nursing notes.  Pertinent labs & imaging results that were available during my care of the patient were reviewed by me and considered in my medical decision making (see chart for details).     Patient has 2 abscesses to abdomen, seem superficial.  I&D attempted on superior abscess, very small amount of pustular drainage, also with thicker/cyst like drainage.  Initiated on doxycycline to take twice daily x10 days, warm  compresses, monitor for gradual resolution.  Discussed strict return precautions. Patient verbalized understanding and is agreeable with plan.  Final Clinical Impressions(s) / UC Diagnoses   Final diagnoses:  Abscess of skin of abdomen     Discharge Instructions     Please begin doxycycline for 10 days  Apply warm compresses/hot rags to area with massage to express further drainage especially the first 24-48 hours  Use anti-inflammatories for pain/swelling. You may  take up to 800 mg Ibuprofen every 8 hours with food. You may supplement Ibuprofen with Tylenol [phone] mg every 8 hours.   Return if symptoms returning or not improving   ED Prescriptions    Medication Sig Dispense Auth. Provider   ibuprofen (ADVIL) 800 MG tablet Take 1 tablet (800 mg total) by mouth 3 (three) times daily. 21 tablet Casimir Barcellos C, PA-C   doxycycline (VIBRAMYCIN) 100 MG capsule Take 1 capsule (100 mg total) by mouth 2 (two) times daily for 10 days. 20 capsule Xaviera Flaten, [place] C, PA-C     PDMP not reviewed this encounter.   Lew Dawes, PA-C 01/06/20 1048

## 2020-01-06 NOTE — ED Triage Notes (Signed)
Pt c/o abscess x2 around umbilicus x1 week; some drainage. Pt states he frequently shaves area. Recently tested positive for mononucleosis. Denies fever, chills.  Two areas of erythema/dried exudate noted; one above and one below umbilicus; indurated.

## 2020-05-12 ENCOUNTER — Ambulatory Visit (INDEPENDENT_AMBULATORY_CARE_PROVIDER_SITE_OTHER): Payer: Medicaid Other

## 2020-05-12 ENCOUNTER — Ambulatory Visit (HOSPITAL_COMMUNITY)
Admission: EM | Admit: 2020-05-12 | Discharge: 2020-05-12 | Disposition: A | Discharge status: Home / Self Care | Payer: Medicaid Other | Attending: Family Medicine | Admitting: Family Medicine

## 2020-05-12 ENCOUNTER — Encounter (HOSPITAL_COMMUNITY): Payer: Self-pay

## 2020-05-12 DIAGNOSIS — M545 Low back pain, unspecified: Secondary | ICD-10-CM

## 2020-05-12 DIAGNOSIS — M549 Dorsalgia, unspecified: Secondary | ICD-10-CM

## 2020-05-12 LAB — POCT URINALYSIS DIP (DEVICE)
Bilirubin Urine: NEGATIVE
Glucose, UA: NEGATIVE mg/dL
Hgb urine dipstick: NEGATIVE
Ketones, ur: NEGATIVE mg/dL
Leukocytes,Ua: NEGATIVE
Nitrite: NEGATIVE
Protein, ur: NEGATIVE mg/dL
Specific Gravity, Urine: 1.02 (ref 1.005–1.030)
Urobilinogen, UA: 0.2 mg/dL (ref 0.0–1.0)
pH: 5.5 (ref 5.0–8.0)

## 2020-05-12 MED ORDER — NAPROXEN 500 MG PO TABS: 500.0000 mg | ORAL_TABLET | Freq: Two times a day (BID) | ORAL | 0 refills | Status: DC

## 2020-05-12 MED ORDER — CYCLOBENZAPRINE HCL 10 MG PO TABS: 5.0000 mg | ORAL_TABLET | Freq: Every day | ORAL | 0 refills | Status: AC

## 2020-05-12 NOTE — Discharge Instructions (Addendum)
Your back pain is most likely muscle related.  Your x-ray was completely normal.  Your urine did not show any infection. If this continues recommend follow-up with primary care Contacts put on your discharge papers for primary care Treating your back pain with naproxen twice a day.  Take this with food. Flexeril as needed at bedtime for muscle relaxant.  This may make you drowsy.

## 2020-05-12 NOTE — ED Triage Notes (Signed)
Pt c/o 8/10 throbbing lower back painx1 wk. Pt denies injury. Pt denies numbness and tingling. Pt denies urinary issue. Pt was able to walk to exam room.

## 2020-05-13 NOTE — ED Provider Notes (Signed)
[place]    CSN: [id number] Arrival date & time: 05/12/20  5462      History   Chief Complaint Chief Complaint  Patient presents with  . Back Pain    HPI Tony Vargas is a 19 y.o. male.   Patient is a 19 year old male who presents today with lower back pain.  Symptoms have been ongoing over the past few months but worsening over the past week.  Describes the pain as throbbing.  Has not take anything for his symptoms.  The pain does not radiate and there is no associated numbness or tingling.  Denies any urinary issues.  Ambulate without any difficulty.  Works as a Sports coach.  Denies any injuries, falls or heavy lifting.  ROS per HPI      History reviewed. No pertinent past medical history.  There are no problems to display for this patient.   History reviewed. No pertinent surgical history.     Home Medications    Prior to Admission medications   Medication Sig Start Date End Date Taking? Authorizing Provider  cyclobenzaprine (FLEXERIL) 10 MG tablet Take 0.5 tablets (5 mg total) by mouth at bedtime. 05/12/20   Loura Halt A, NP  naproxen (NAPROSYN) 500 MG tablet Take 1 tablet (500 mg total) by mouth 2 (two) times daily. 05/12/20   Orvan July, NP    Family History Family History  Problem Relation Age of Onset  . Healthy Mother   . Healthy Father     Social History Social History   Tobacco Use  . Smoking status: Never Smoker  . Smokeless tobacco: Never Used  Substance Use Topics  . Alcohol use: Yes    Comment: occ  . Drug use: Not Currently     Allergies   Grapeseed extract [nutritional supplements]   Review of Systems Review of Systems   Physical Exam Triage Vital Signs ED Triage Vitals  Enc Vitals Group     BP 05/12/20 0920 132/75     Pulse Rate 05/12/20 0920 74     Resp 05/12/20 0920 16     Temp 05/12/20 0920 97.8 F (36.6 C)     Temp Source 05/12/20 0920 Oral     SpO2 05/12/20 0920 100 %     Weight 05/12/20 0922  140 lb (63.5 kg)     Height 05/12/20 0922 6' (1.829 m)     Head Circumference --      Peak Flow --      Pain Score 05/12/20 0922 8     Pain Loc --      Pain Edu? --      Excl. in [place]? --    No data found.  Updated Vital Signs BP 132/75   Pulse 74   Temp 97.8 F (36.6 C) (Oral)   Resp 16   Ht 6' (1.829 m)   Wt 140 lb (63.5 kg)   SpO2 100%   BMI 18.99 kg/m   Visual Acuity Right Eye Distance:   Left Eye Distance:   Bilateral Distance:    Right Eye Near:   Left Eye Near:    Bilateral Near:     Physical Exam Vitals and nursing note reviewed.  Constitutional:      Appearance: Normal appearance.  HENT:     Head: Normocephalic and atraumatic.     Nose: Nose normal.  Eyes:     Conjunctiva/sclera: Conjunctivae normal.  Pulmonary:     Effort: Pulmonary effort is normal.  Musculoskeletal:  General: Normal range of motion.     Cervical back: Normal range of motion.  Skin:    General: Skin is warm and dry.  Neurological:     Mental Status: He is alert.  Psychiatric:        Mood and Affect: Mood normal.      UC Treatments / Results  Labs (all labs ordered are listed, but only abnormal results are displayed) Labs Reviewed  POCT URINALYSIS DIP (DEVICE)    EKG   Radiology DG Lumbar Spine Complete  Result Date: 05/12/2020 CLINICAL DATA:  Low back pain, no injury EXAM: LUMBAR SPINE - COMPLETE 4+ VIEW COMPARISON:  None. FINDINGS: There is no evidence of lumbar spine fracture. Alignment is normal. Intervertebral disc spaces are maintained. IMPRESSION: Negative. Electronically Signed   By: Charlett Nose M.D.   On: 05/12/2020 10:12    Procedures Procedures (including critical care time)  Medications Ordered in UC Medications - No data to display  Initial Impression / Assessment and Plan / UC Course  I have reviewed the triage vital signs and the nursing notes.  Pertinent labs & imaging results that were available during my care of the patient were reviewed  by me and considered in my medical decision making (see chart for details).     Lower back pain. X-ray without any acute findings. Most likely muscle strain, overuse. Urine negative for any infection. Recommend follow-up primary care for further management.  Contact spell discharge instructions for primary care. Treating with naproxen twice a day for pain, inflammation. Flexeril as needed at bedtime for muscle relaxant Follow up as needed for continued or worsening symptoms  Final Clinical Impressions(s) / UC Diagnoses   Final diagnoses:  Acute bilateral low back pain without sciatica     Discharge Instructions     Your back pain is most likely muscle related.  Your x-ray was completely normal.  Your urine did not show any infection. If this continues recommend follow-up with primary care Contacts put on your discharge papers for primary care Treating your back pain with naproxen twice a day.  Take this with food. Flexeril as needed at bedtime for muscle relaxant.  This may make you drowsy.    ED Prescriptions    Medication Sig Dispense Auth. Provider   naproxen (NAPROSYN) 500 MG tablet Take 1 tablet (500 mg total) by mouth 2 (two) times daily. 30 tablet Brucha Ahlquist A, NP   cyclobenzaprine (FLEXERIL) 10 MG tablet Take 0.5 tablets (5 mg total) by mouth at bedtime. 20 tablet Dahlia Byes A, NP     PDMP not reviewed this encounter.   Dahlia Byes A, NP 05/13/20 1051

## 2020-06-23 DIAGNOSIS — Z419 Encounter for procedure for purposes other than remedying health state, unspecified: Secondary | ICD-10-CM | POA: Diagnosis not present

## 2020-07-08 ENCOUNTER — Other Ambulatory Visit: Payer: Self-pay

## 2020-07-08 ENCOUNTER — Ambulatory Visit (HOSPITAL_COMMUNITY)
Admission: EM | Admit: 2020-07-08 | Discharge: 2020-07-08 | Disposition: A | Discharge status: Home / Self Care | Payer: Medicaid Other | Attending: Emergency Medicine | Admitting: Emergency Medicine

## 2020-07-08 ENCOUNTER — Encounter (HOSPITAL_COMMUNITY): Payer: Self-pay

## 2020-07-08 DIAGNOSIS — M545 Low back pain, unspecified: Secondary | ICD-10-CM

## 2020-07-08 MED ORDER — MELOXICAM 7.5 MG PO TABS: 7.5000 mg | ORAL_TABLET | Freq: Every day | ORAL | 0 refills | Status: AC
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
## 2020-07-08 NOTE — Discharge Instructions (Signed)
Light and regular activity as tolerated.  See exercises provided.  Heat application while active can help with muscle spasms.  Sleep with pillow under your knees.   May use 1/2 tab of the muscle relaxer you have been previously provided, as needed, at night.  Meloxicam daily. Don't take additional ibuprofen for aleve. Take with food.  Please follow up with your primary care provider as needed if symptoms persist as may need follow up with physical therapy or additional management.

## 2020-07-24 DIAGNOSIS — Z419 Encounter for procedure for purposes other than remedying health state, unspecified: Secondary | ICD-10-CM | POA: Diagnosis not present

## 2020-08-24 DIAGNOSIS — Z419 Encounter for procedure for purposes other than remedying health state, unspecified: Secondary | ICD-10-CM | POA: Diagnosis not present

## 2020-09-23 DIAGNOSIS — Z419 Encounter for procedure for purposes other than remedying health state, unspecified: Secondary | ICD-10-CM | POA: Diagnosis not present

## 2020-10-24 DIAGNOSIS — Z419 Encounter for procedure for purposes other than remedying health state, unspecified: Secondary | ICD-10-CM | POA: Diagnosis not present

## 2020-11-23 DIAGNOSIS — Z419 Encounter for procedure for purposes other than remedying health state, unspecified: Secondary | ICD-10-CM | POA: Diagnosis not present

## 2020-12-24 DIAGNOSIS — Z419 Encounter for procedure for purposes other than remedying health state, unspecified: Secondary | ICD-10-CM | POA: Diagnosis not present

## 2021-01-24 DIAGNOSIS — Z419 Encounter for procedure for purposes other than remedying health state, unspecified: Secondary | ICD-10-CM | POA: Diagnosis not present

## 2021-02-21 DIAGNOSIS — Z419 Encounter for procedure for purposes other than remedying health state, unspecified: Secondary | ICD-10-CM | POA: Diagnosis not present

## 2021-03-24 DIAGNOSIS — Z419 Encounter for procedure for purposes other than remedying health state, unspecified: Secondary | ICD-10-CM | POA: Diagnosis not present

## 2021-04-23 DIAGNOSIS — Z419 Encounter for procedure for purposes other than remedying health state, unspecified: Secondary | ICD-10-CM | POA: Diagnosis not present

## 2021-05-24 DIAGNOSIS — Z419 Encounter for procedure for purposes other than remedying health state, unspecified: Secondary | ICD-10-CM | POA: Diagnosis not present

## 2021-06-13 ENCOUNTER — Emergency Department (HOSPITAL_COMMUNITY)
Admission: EM | Admit: 2021-06-13 | Discharge: 2021-06-13 | Discharge status: Against Medical Advice | Payer: Medicaid Other | Attending: Emergency Medicine | Admitting: Emergency Medicine

## 2021-06-13 DIAGNOSIS — Z041 Encounter for examination and observation following transport accident: Secondary | ICD-10-CM | POA: Insufficient documentation

## 2021-06-13 DIAGNOSIS — Z5321 Procedure and treatment not carried out due to patient leaving prior to being seen by health care provider: Secondary | ICD-10-CM | POA: Insufficient documentation

## 2021-06-13 DIAGNOSIS — Y92410 Unspecified street and highway as the place of occurrence of the external cause: Secondary | ICD-10-CM | POA: Diagnosis not present

## 2021-06-13 NOTE — ED Notes (Signed)
Pt left AMA °

## 2021-06-15 ENCOUNTER — Encounter (HOSPITAL_COMMUNITY): Payer: Self-pay

## 2021-06-15 ENCOUNTER — Other Ambulatory Visit: Payer: Self-pay

## 2021-06-15 ENCOUNTER — Ambulatory Visit (HOSPITAL_COMMUNITY)
Admission: EM | Admit: 2021-06-15 | Discharge: 2021-06-15 | Disposition: A | Discharge status: Home / Self Care | Payer: Medicaid Other | Attending: Family Medicine | Admitting: Family Medicine

## 2021-06-15 DIAGNOSIS — M79605 Pain in left leg: Secondary | ICD-10-CM

## 2021-06-15 DIAGNOSIS — L0291 Cutaneous abscess, unspecified: Secondary | ICD-10-CM

## 2021-06-15 DIAGNOSIS — M79604 Pain in right leg: Secondary | ICD-10-CM | POA: Diagnosis not present

## 2021-06-15 DIAGNOSIS — M79672 Pain in left foot: Secondary | ICD-10-CM | POA: Diagnosis not present

## 2021-06-15 DIAGNOSIS — T148XXA Other injury of unspecified body region, initial encounter: Secondary | ICD-10-CM

## 2021-06-15 MED ORDER — HIBICLENS 4 % EX LIQD: Freq: Every day | CUTANEOUS | 0 refills | Status: AC | PRN

## 2021-06-15 MED ORDER — LIDOCAINE-EPINEPHRINE 1 %-1:100000 IJ SOLN
INTRAMUSCULAR | Status: AC
  Filled 2021-06-15: qty 1

## 2021-06-15 MED ORDER — CEPHALEXIN 500 MG PO CAPS: 500.0000 mg | ORAL_CAPSULE | Freq: Two times a day (BID) | ORAL | 0 refills | Status: AC
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
## 2021-06-23 DIAGNOSIS — Z419 Encounter for procedure for purposes other than remedying health state, unspecified: Secondary | ICD-10-CM | POA: Diagnosis not present

## 2021-06-27 ENCOUNTER — Encounter (HOSPITAL_COMMUNITY): Payer: Self-pay | Admitting: Emergency Medicine

## 2021-06-27 ENCOUNTER — Ambulatory Visit (HOSPITAL_COMMUNITY)
Admission: EM | Admit: 2021-06-27 | Discharge: 2021-06-27 | Disposition: A | Discharge status: Home / Self Care | Payer: Medicaid Other | Attending: Emergency Medicine | Admitting: Emergency Medicine

## 2021-06-27 ENCOUNTER — Other Ambulatory Visit: Payer: Self-pay

## 2021-06-27 DIAGNOSIS — L02411 Cutaneous abscess of right axilla: Secondary | ICD-10-CM | POA: Diagnosis not present

## 2021-06-27 MED ORDER — DOXYCYCLINE HYCLATE 100 MG PO CAPS: 100.0000 mg | ORAL_CAPSULE | Freq: Two times a day (BID) | ORAL | 0 refills | Status: DC

## 2021-06-27 NOTE — Discharge Instructions (Addendum)
Take antibiotic twice a day for 7 days  Hold warm compresses to area to help drain  If pain worsens, swelling increases, fever, chills, abscess does not drain or heal, can follow up at urgent care for evaluation  If abscess continue to occur under arm can follow up with general surgery for evaluation

## 2021-06-27 NOTE — ED Provider Notes (Signed)
[facility]    CSN: [id number] Arrival date & time: 06/27/21  8527      History   Chief Complaint Chief Complaint  Patient presents with   Abscess   Arm Pain    HPI Tony Vargas is a 20 y.o. male.   Patient presents with nodule under right axilla, requesting reevaluation after I&D completed on 6/23. Completed antibiotic course. Area tender, swollen and painful. Has been using black seed oil and warm compresses with no relief. Area has not been draining. No pertinent medical history. Denies fever and chills.   History reviewed. No pertinent past medical history.  There are no problems to display for this patient.   History reviewed. No pertinent surgical history.     Home Medications    Prior to Admission medications   Medication Sig Start Date End Date Taking? Authorizing Provider  doxycycline (VIBRAMYCIN) 100 MG capsule Take 1 capsule (100 mg total) by mouth 2 (two) times daily. 06/27/21  Yes Weslee Fogg R, NP  cephALEXin (KEFLEX) 500 MG capsule Take 1 capsule (500 mg total) by mouth 2 (two) times daily. 06/15/21   Particia Nearing, PA-C  chlorhexidine (HIBICLENS) 4 % external liquid Apply topically daily as needed. 06/15/21   Particia Nearing, PA-C  cyclobenzaprine (FLEXERIL) 10 MG tablet Take 0.5 tablets (5 mg total) by mouth at bedtime. 05/12/20   Dahlia Byes A, NP  meloxicam (MOBIC) 7.5 MG tablet Take 1 tablet (7.5 mg total) by mouth daily. 07/08/20   Georgetta Haber, NP    Family History Family History  Problem Relation Age of Onset   Healthy Mother    Healthy Father     Social History Social History   Tobacco Use   Smoking status: Never   Smokeless tobacco: Never  Vaping Use   Vaping Use: Never used  Substance Use Topics   Alcohol use: Yes    Comment: occ   Drug use: Not Currently     Allergies   Grapeseed extract [nutritional supplements]   Review of Systems Review of Systems  Constitutional: Negative.    Respiratory: Negative.    Cardiovascular: Negative.   Skin:  Positive for wound. Negative for color change, pallor and rash.  Neurological: Negative.     Physical Exam Triage Vital Signs ED Triage Vitals  Enc Vitals Group     BP 06/27/21 1014 (!) 138/92     Pulse Rate 06/27/21 1014 98     Resp 06/27/21 1014 17     Temp 06/27/21 1014 99.2 F (37.3 C)     Temp Source 06/27/21 1014 Oral     SpO2 06/27/21 1014 98 %     Weight --      Height --      Head Circumference --      Peak Flow --      Pain Score 06/27/21 1013 7     Pain Loc --      Pain Edu? --      Excl. in GC? --    No data found.  Updated Vital Signs BP (!) 138/92 (BP Location: Left Arm)   Pulse 98   Temp 99.2 F (37.3 C) (Oral)   Resp 17   SpO2 98%   Visual Acuity Right Eye Distance:   Left Eye Distance:   Bilateral Distance:    Right Eye Near:   Left Eye Near:    Bilateral Near:     Physical Exam Constitutional:  Appearance: Normal appearance. He is normal weight.  Eyes:     Extraocular Movements: Extraocular movements intact.  Pulmonary:     Effort: Pulmonary effort is normal.  Skin:    Comments: Immature abscess 3x2 present under right axilla, swollen, tender and erythematous but very firm  Neurological:     Mental Status: He is alert and oriented to person, place, and time. Mental status is at baseline.  Psychiatric:        Mood and Affect: Mood normal.        Behavior: Behavior normal.     UC Treatments / Results  Labs (all labs ordered are listed, but only abnormal results are displayed) Labs Reviewed - No data to display  EKG   Radiology No results found.  Procedures Procedures (including critical care time)  Medications Ordered in UC Medications - No data to display  Initial Impression / Assessment and Plan / UC Course  I have reviewed the triage vital signs and the nursing notes.  Pertinent labs & imaging results that were available during my care of the patient  were reviewed by me and considered in my medical decision making (see chart for details).  Abscess of right axilla  Doxycyline 100 mg bid for  7 days Warm compresses at least four times a day  Return precautions given for non draining or non healing site General surgery referral given  Final Clinical Impressions(s) / UC Diagnoses   Final diagnoses:  Abscess of right axilla     Discharge Instructions      Take antibiotic twice a day for 7 days  Hold warm compresses to area to help drain  If pain worsens, swelling increases, fever, chills, abscess does not drain or heal, can follow up at urgent care for evaluation  If abscess continue to occur under arm can follow up with general surgery for evaluation    ED Prescriptions     Medication Sig Dispense Auth. Provider   doxycycline (VIBRAMYCIN) 100 MG capsule Take 1 capsule (100 mg total) by mouth 2 (two) times daily. 14 capsule Evarose Altland, Elita Boone, NP      PDMP not reviewed this encounter.   Valinda Hoar, NP 06/27/21 1044

## 2021-06-27 NOTE — ED Triage Notes (Signed)
Pt presents for re evaluation after having abscess drained on 6/23. States there are now painful knots under his arm.

## 2021-07-24 DIAGNOSIS — Z419 Encounter for procedure for purposes other than remedying health state, unspecified: Secondary | ICD-10-CM | POA: Diagnosis not present

## 2021-08-02 ENCOUNTER — Other Ambulatory Visit: Payer: Self-pay

## 2021-08-02 ENCOUNTER — Ambulatory Visit (HOSPITAL_COMMUNITY)
Admission: EM | Admit: 2021-08-02 | Discharge: 2021-08-02 | Disposition: A | Discharge status: Home / Self Care | Payer: Medicaid Other | Attending: Emergency Medicine | Admitting: Emergency Medicine

## 2021-08-02 ENCOUNTER — Encounter (HOSPITAL_COMMUNITY): Payer: Self-pay

## 2021-08-02 DIAGNOSIS — S61411A Laceration without foreign body of right hand, initial encounter: Secondary | ICD-10-CM | POA: Diagnosis not present

## 2021-08-02 DIAGNOSIS — M79641 Pain in right hand: Secondary | ICD-10-CM | POA: Diagnosis not present

## 2021-08-02 DIAGNOSIS — Z23 Encounter for immunization: Secondary | ICD-10-CM | POA: Diagnosis not present

## 2021-08-02 MED ORDER — TETANUS-DIPHTH-ACELL PERTUSSIS 5-2.5-18.5 LF-MCG/0.5 IM SUSY
0.5000 mL | PREFILLED_SYRINGE | Freq: Once | INTRAMUSCULAR | Status: AC
  Administered 2021-08-02: 0.5 mL via INTRAMUSCULAR

## 2021-08-02 MED ORDER — TETANUS-DIPHTH-ACELL PERTUSSIS 5-2.5-18.5 LF-MCG/0.5 IM SUSY
PREFILLED_SYRINGE | INTRAMUSCULAR | Status: AC
  Filled 2021-08-02: qty 0.5

## 2021-08-02 NOTE — ED Provider Notes (Signed)
[facility]    CSN: [id number] Arrival date & time: 08/02/21  0805      History   Chief Complaint Chief Complaint  Patient presents with   Laceration    HPI Tony Vargas is a 20 y.o. male.   Patient here for evaluation of right hand laceration that occurred on Monday evening.  Reports getting hit with a metal plate at work.  Reports cleaning wound after injury.  Reports still having some bleeding.  Unsure of last tetanus.  Denies any specific alleviating or aggravating factors.  Denies any fevers, chest pain, shortness of breath, N/V/D, numbness, tingling, weakness, abdominal pain, or headaches.    The history is provided by the patient.  Laceration  History reviewed. No pertinent past medical history.  There are no problems to display for this patient.   History reviewed. No pertinent surgical history.     Home Medications    Prior to Admission medications   Medication Sig Start Date End Date Taking? Authorizing Provider  cephALEXin (KEFLEX) 500 MG capsule Take 1 capsule (500 mg total) by mouth 2 (two) times daily. 06/15/21   Particia Nearing, PA-C  chlorhexidine (HIBICLENS) 4 % external liquid Apply topically daily as needed. 06/15/21   Particia Nearing, PA-C  cyclobenzaprine (FLEXERIL) 10 MG tablet Take 0.5 tablets (5 mg total) by mouth at bedtime. 05/12/20   Dahlia Byes A, NP  doxycycline (VIBRAMYCIN) 100 MG capsule Take 1 capsule (100 mg total) by mouth 2 (two) times daily. 06/27/21   White, Elita Boone, NP  meloxicam (MOBIC) 7.5 MG tablet Take 1 tablet (7.5 mg total) by mouth daily. 07/08/20   Georgetta Haber, NP    Family History Family History  Problem Relation Age of Onset   Healthy Mother    Healthy Father     Social History Social History   Tobacco Use   Smoking status: Never   Smokeless tobacco: Never  Vaping Use   Vaping Use: Never used  Substance Use Topics   Alcohol use: Yes    Comment: occ   Drug use: Not Currently      Allergies   Grapeseed extract [nutritional supplements]   Review of Systems Review of Systems  Skin:  Positive for wound.  All other systems reviewed and are negative.   Physical Exam Triage Vital Signs ED Triage Vitals  Enc Vitals Group     BP 08/02/21 0841 124/73     Pulse Rate 08/02/21 0841 86     Resp 08/02/21 0841 16     Temp 08/02/21 0841 98.9 F (37.2 C)     Temp Source 08/02/21 0841 Oral     SpO2 08/02/21 0841 100 %     Weight --      Height --      Head Circumference --      Peak Flow --      Pain Score 08/02/21 0840 0     Pain Loc --      Pain Edu? --      Excl. in GC? --    No data found.  Updated Vital Signs BP 124/73 (BP Location: Left Arm)   Pulse 86   Temp 98.9 F (37.2 C) (Oral)   Resp 16   SpO2 100%   Visual Acuity Right Eye Distance:   Left Eye Distance:   Bilateral Distance:    Right Eye Near:   Left Eye Near:    Bilateral Near:     Physical  Exam Vitals and nursing note reviewed.  Constitutional:      General: He is not in acute distress.    Appearance: Normal appearance. He is not ill-appearing, toxic-appearing or diaphoretic.  HENT:     Head: Normocephalic and atraumatic.  Eyes:     Conjunctiva/sclera: Conjunctivae normal.  Cardiovascular:     Rate and Rhythm: Normal rate.     Pulses: Normal pulses.  Pulmonary:     Effort: Pulmonary effort is normal.  Abdominal:     General: Abdomen is flat.  Musculoskeletal:        General: Normal range of motion.     Cervical back: Normal range of motion.  Skin:    General: Skin is warm and dry.     Findings: Laceration (right palm) present.  Neurological:     General: No focal deficit present.     Mental Status: He is alert and oriented to person, place, and time.  Psychiatric:        Mood and Affect: Mood normal.      UC Treatments / Results  Labs (all labs ordered are listed, but only abnormal results are displayed) Labs Reviewed - No data to  display  EKG   Radiology No results found.  Procedures Laceration Repair  Date/Time: 08/02/2021 9:12 AM Performed by: Ivette Loyal, NP Authorized by: Ivette Loyal, NP   Consent:    Consent obtained:  Verbal   Consent given by:  Patient   Risks, benefits, and alternatives were discussed: yes     Risks discussed:  Infection, pain, poor cosmetic result and poor wound healing   Alternatives discussed:  No treatment, delayed treatment and observation Universal protocol:    Patient identity confirmed:  Verbally with patient and arm band Anesthesia:    Anesthesia method:  None Laceration details:    Location:  Hand   Hand location:  R palm   Length (cm):  1   Depth (mm):  1 Treatment:    Area cleansed with:  Shur-Clens   Amount of cleaning:  Standard Skin repair:    Repair method:  Steri-Strips   Number of Steri-Strips:  3 Approximation:    Approximation:  Loose Repair type:    Repair type:  Simple Post-procedure details:    Dressing:  Non-adherent dressing   Procedure completion:  Tolerated well, no immediate complications (including critical care time)  Medications Ordered in UC Medications  Tdap (BOOSTRIX) injection 0.5 mL (0.5 mLs Intramuscular Given 08/02/21 0914)    Initial Impression / Assessment and Plan / UC Course  I have reviewed the triage vital signs and the nursing notes.  Pertinent labs & imaging results that were available during my care of the patient were reviewed by me and considered in my medical decision making (see chart for details).    Assessment negative for red flags or concerns. Sutures not recommended at this time due to age of injury.  Steri-strips applied as described.  Discussed need to keep hand dry to 12-24 hours.  Instructed to keep wound clean and dry.  Follow up for any signs of infection.   Final Clinical Impressions(s) / UC Diagnoses   Final diagnoses:  Laceration of right hand without foreign body, initial encounter  Pain  of right hand     Discharge Instructions      Do not get your hand wet for the next 12-24 hours.  After that you can wash your han like normal but do not soak your hand in  water.    You can clean it with warm soapy water and pat dry.  Do not rub.   The steri-strips will start to fall off on their own in the next few days.    Return if you notice any increased swelling, redness, red streaks, or yellow or green drainage.       ED Prescriptions   None    PDMP not reviewed this encounter.   Ivette Loyal, NP 08/02/21 [phone]

## 2021-08-02 NOTE — Discharge Instructions (Addendum)
Do not get your hand wet for the next 12-24 hours.  After that you can wash your han like normal but do not soak your hand in water.    You can clean it with warm soapy water and pat dry.  Do not rub.   The steri-strips will start to fall off on their own in the next few days.    Return if you notice any increased swelling, redness, red streaks, or yellow or green drainage.

## 2021-08-02 NOTE — ED Triage Notes (Signed)
Pt reports laceration in the right hand x 2 days. States he cut the hand with a metal piece at work.   Pt can't remembered the last Tdap.

## 2021-08-24 DIAGNOSIS — Z419 Encounter for procedure for purposes other than remedying health state, unspecified: Secondary | ICD-10-CM | POA: Diagnosis not present

## 2021-09-19 ENCOUNTER — Other Ambulatory Visit: Payer: Self-pay

## 2021-09-19 ENCOUNTER — Ambulatory Visit
Admission: RE | Admit: 2021-09-19 | Discharge: 2021-09-19 | Disposition: A | Discharge status: Home / Self Care | Payer: Medicaid Other | Source: Ambulatory Visit | Attending: Urgent Care | Admitting: Urgent Care

## 2021-09-19 VITALS — BP 131/82 | HR 82 | Temp 98.1°F | Resp 16

## 2021-09-19 DIAGNOSIS — L0211 Cutaneous abscess of neck: Secondary | ICD-10-CM

## 2021-09-19 MED ORDER — DOXYCYCLINE HYCLATE 100 MG PO CAPS: 100.0000 mg | ORAL_CAPSULE | Freq: Two times a day (BID) | ORAL | 0 refills | Status: AC

## 2021-09-19 MED ORDER — NAPROXEN 500 MG PO TABS: 500.0000 mg | ORAL_TABLET | Freq: Two times a day (BID) | ORAL | 0 refills | Status: AC

## 2021-09-19 NOTE — ED Triage Notes (Signed)
Neck abscess x 3 days, hx of same

## 2021-09-19 NOTE — Discharge Instructions (Signed)
Please change your dressing 3-5 times daily. Do not apply any ointments or creams. Each time you change your dressing, make sure that you are pressing on the wound to get pus to come out.  Try your best to have a family member help you clean gently around the perimeter of the wound with gentle soap and warm water. Pat your wound dry and let it air out if possible to make sure it is dry before reapplying another dressing.   

## 2021-09-19 NOTE — ED Provider Notes (Signed)
  [facility]   MRN: [id number] DOB: Sep 02, 2001  Subjective:   Tony Vargas is a 20 y.o. male presenting for 3-day history of multiple about the right side of his neck.  Patient states that his shoulder was a pimple and so he started to squeeze it and get it to drain.  It has since expanded and is causing some pain, it is draining.  He has kept it covered with a Band-Aid.  Denies fever, nausea, vomiting, chest pain, painful swallowing, difficulty breathing.  Has a history of abscesses.  Denies taking chronic medications.  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 No pertinent past medical history.   History reviewed. No pertinent surgical history.  Family History  Problem Relation Age of Onset   Healthy Mother    Healthy Father     Social History   Tobacco Use   Smoking status: Never   Smokeless tobacco: Never  Vaping Use   Vaping Use: Never used  Substance Use Topics   Alcohol use: Yes    Comment: occ   Drug use: Not Currently    ROS   Objective:   Vitals: BP 131/82 (BP Location: Left Arm)   Pulse 82   Temp 98.1 F (36.7 C) (Oral)   Resp 16   SpO2 98%   Physical Exam Constitutional:      General: He is not in acute distress.    Appearance: Normal appearance. He is well-developed and normal weight. He is not ill-appearing, toxic-appearing or diaphoretic.  HENT:     Head: Normocephalic and atraumatic.     Right Ear: External ear normal.     Left Ear: External ear normal.     Nose: Nose normal.     Mouth/Throat:     Pharynx: Oropharynx is clear.  Eyes:     General: No scleral icterus.       Right eye: No discharge.        Left eye: No discharge.     Extraocular Movements: Extraocular movements intact.     Pupils: Pupils are equal, round, and reactive to light.  Neck:   Cardiovascular:     Rate and Rhythm: Normal  rate.  Pulmonary:     Effort: Pulmonary effort is normal.  Musculoskeletal:     Cervical back: Normal range of motion.  Neurological:     Mental Status: He is alert and oriented to person, place, and time.  Psychiatric:        Mood and Affect: Mood normal.        Behavior: Behavior normal.        Thought Content: Thought content normal.        Judgment: Judgment normal.    Dressing applied using nonadherent dressing, secured with Coban at patient's request.  Assessment and Plan :   PDMP not reviewed this encounter.  1. Abscess, neck     Start doxycycline for an open draining abscess.  Recommend naproxen for pain control.  Counseled on wound care. Counseled patient on potential for adverse effects with medications prescribed/recommended today, ER and return-to-clinic precautions discussed, patient verbalized understanding.    Wallis Bamberg, PA-C 09/19/21 1054

## 2021-09-23 DIAGNOSIS — Z419 Encounter for procedure for purposes other than remedying health state, unspecified: Secondary | ICD-10-CM | POA: Diagnosis not present

## 2021-10-24 DIAGNOSIS — Z419 Encounter for procedure for purposes other than remedying health state, unspecified: Secondary | ICD-10-CM | POA: Diagnosis not present

## 2021-11-01 ENCOUNTER — Emergency Department (HOSPITAL_COMMUNITY)
Admission: EM | Admit: 2021-11-01 | Discharge: 2021-11-01 | Discharge status: Against Medical Advice | Payer: Medicaid Other | Source: Home / Self Care

## 2021-11-01 ENCOUNTER — Ambulatory Visit
Admission: EM | Admit: 2021-11-01 | Discharge: 2021-11-01 | Discharge status: Against Medical Advice | Payer: Medicaid Other

## 2021-11-01 ENCOUNTER — Other Ambulatory Visit: Payer: Self-pay

## 2021-11-02 ENCOUNTER — Ambulatory Visit
Admission: RE | Admit: 2021-11-02 | Discharge: 2021-11-02 | Disposition: A | Discharge status: Home / Self Care | Payer: Medicaid Other | Source: Ambulatory Visit | Attending: Internal Medicine | Admitting: Internal Medicine

## 2021-11-02 VITALS — BP 137/93 | HR 106 | Temp 99.4°F | Resp 18

## 2021-11-02 DIAGNOSIS — J069 Acute upper respiratory infection, unspecified: Secondary | ICD-10-CM | POA: Diagnosis not present

## 2021-11-02 DIAGNOSIS — Z202 Contact with and (suspected) exposure to infections with a predominantly sexual mode of transmission: Secondary | ICD-10-CM | POA: Diagnosis not present

## 2021-11-02 DIAGNOSIS — N489 Disorder of penis, unspecified: Secondary | ICD-10-CM | POA: Diagnosis not present

## 2021-11-02 MED ORDER — VALACYCLOVIR HCL 1 G PO TABS: 1000.0000 mg | ORAL_TABLET | Freq: Two times a day (BID) | ORAL | 0 refills | Status: AC

## 2021-11-02 NOTE — ED Provider Notes (Signed)
[facility]    CSN: [id number] Arrival date & time: 11/02/21  1257      History   Chief Complaint Chief Complaint  Patient presents with   Cough    HPI Tony Vargas is a 20 y.o. male.   Patient presents with 5-day history of cough, chills, body aches, nasal congestion.  Patient not sure of T-max at home.  Reports that he was exposed to somebody who tested positive for the flu.  Denies chest pain, shortness of breath, nausea, vomiting, abdominal pain, diarrhea.  Patient also reports that he has lesions on his penile shaft and feels that his "skin is tearing" when he has sexual intercourse.  Denies any penile discharge, urinary burning, urinary frequency, testicular pain, back pain, fever, abdominal pain.  Patient reports that his sexual partner recently tested positive for genital herpes.   Cough  History reviewed. No pertinent past medical history.  There are no problems to display for this patient.   History reviewed. No pertinent surgical history.     Home Medications    Prior to Admission medications   Medication Sig Start Date End Date Taking? Authorizing Provider  valACYclovir (VALTREX) 1000 MG tablet Take 1 tablet (1,000 mg total) by mouth 2 (two) times daily for 7 days. 11/02/21 11/09/21 Yes Payslie Mccaig, Acie Fredrickson, FNP  cephALEXin (KEFLEX) 500 MG capsule Take 1 capsule (500 mg total) by mouth 2 (two) times daily. 06/15/21   Particia Nearing, PA-C  chlorhexidine (HIBICLENS) 4 % external liquid Apply topically daily as needed. 06/15/21   Particia Nearing, PA-C  cyclobenzaprine (FLEXERIL) 10 MG tablet Take 0.5 tablets (5 mg total) by mouth at bedtime. 05/12/20   Dahlia Byes A, NP  doxycycline (VIBRAMYCIN) 100 MG capsule Take 1 capsule (100 mg total) by mouth 2 (two) times daily. 09/19/21   Wallis Bamberg, PA-C  meloxicam (MOBIC) 7.5 MG tablet Take 1 tablet (7.5 mg total) by mouth daily. 07/08/20   Georgetta Haber, NP  naproxen (NAPROSYN) 500 MG tablet  Take 1 tablet (500 mg total) by mouth 2 (two) times daily with a meal. 09/19/21   Wallis Bamberg, PA-C    Family History Family History  Problem Relation Age of Onset   Healthy Mother    Healthy Father     Social History Social History   Tobacco Use   Smoking status: Never   Smokeless tobacco: Never  Vaping Use   Vaping Use: Never used  Substance Use Topics   Alcohol use: Yes    Comment: occ   Drug use: Not Currently     Allergies   Grapeseed extract [nutritional supplements]   Review of Systems Review of Systems Per HPI  Physical Exam Triage Vital Signs ED Triage Vitals  Enc Vitals Group     BP 11/02/21 1318 (!) 137/93     Pulse Rate 11/02/21 1318 (!) 106     Resp 11/02/21 1318 18     Temp 11/02/21 1318 99.4 F (37.4 C)     Temp Source 11/02/21 1318 Oral     SpO2 11/02/21 1318 98 %     Weight --      Height --      Head Circumference --      Peak Flow --      Pain Score 11/02/21 1317 0     Pain Loc --      Pain Edu? --      Excl. in GC? --    No data  found.  Updated Vital Signs BP (!) 137/93 (BP Location: Right Arm)   Pulse (!) 106   Temp 99.4 F (37.4 C) (Oral)   Resp 18   SpO2 98%   Visual Acuity Right Eye Distance:   Left Eye Distance:   Bilateral Distance:    Right Eye Near:   Left Eye Near:    Bilateral Near:     Physical Exam Exam conducted with a chaperone present.  Constitutional:      General: He is not in acute distress.    Appearance: Normal appearance. He is not toxic-appearing or diaphoretic.  HENT:     Head: Normocephalic and atraumatic.     Right Ear: Tympanic membrane and ear canal normal.     Left Ear: Tympanic membrane and ear canal normal.     Nose: Congestion present.     Mouth/Throat:     Mouth: Mucous membranes are moist.     Pharynx: No posterior oropharyngeal erythema.  Eyes:     Extraocular Movements: Extraocular movements intact.     Conjunctiva/sclera: Conjunctivae normal.     Pupils: Pupils are equal,  round, and reactive to light.  Cardiovascular:     Rate and Rhythm: Normal rate and regular rhythm.     Pulses: Normal pulses.     Heart sounds: Normal heart sounds.  Pulmonary:     Effort: Pulmonary effort is normal. No respiratory distress.     Breath sounds: Normal breath sounds. No wheezing.  Abdominal:     General: Abdomen is flat. Bowel sounds are normal.     Palpations: Abdomen is soft.  Genitourinary:    Penis: Circumcised.      Testes: Normal. Cremasteric reflex is present.     Comments: Multiple vesicular lesions present throughout distal end of penile shaft. Musculoskeletal:        General: Normal range of motion.     Cervical back: Normal range of motion.  Skin:    General: Skin is warm and dry.  Neurological:     General: No focal deficit present.     Mental Status: He is alert and oriented to person, place, and time. Mental status is at baseline.  Psychiatric:        Mood and Affect: Mood normal.        Behavior: Behavior normal.     UC Treatments / Results  Labs (all labs ordered are listed, but only abnormal results are displayed) Labs Reviewed  HSV CULTURE AND TYPING  RPR    EKG   Radiology No results found.  Procedures Procedures (including critical care time)  Medications Ordered in UC Medications - No data to display  Initial Impression / Assessment and Plan / UC Course  I have reviewed the triage vital signs and the nursing notes.  Pertinent labs & imaging results that were available during my care of the patient were reviewed by me and considered in my medical decision making (see chart for details).     Patient presents with symptoms likely from a viral upper respiratory infection. Differential includes bacterial pneumonia, sinusitis, allergic rhinitis, COVID-19, flu. Do not suspect underlying cardiopulmonary process. Symptoms seem unlikely related to ACS, CHF or COPD exacerbations, pneumonia, pneumothorax. Patient is nontoxic appearing  and not in need of emergent medical intervention.  Patient declined COVID-19 and flu testing.  Recommended symptom control with over the counter medications: Daily oral anti-histamine, Oral decongestant or IN corticosteroid, saline irrigations, cepacol lozenges, Robitussin, Delsym, honey tea.  Highly suspicious for  genital herpes given appearance of penile shaft on exam.  Will start Valtrex due to exposure and physical exam.  HSV test pending.  RPR also pending. Discussed safe sex practices.   Return if symptoms fail to improve in 1-2 weeks or you develop shortness of breath, chest pain, severe headache. Patient states understanding and is agreeable.  Discharged with PCP followup.  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 We recommended symptom control. I expect your symptoms to start improving in the next 1-2 weeks.   1. Take a daily allergy pill/anti-histamine like Zyrtec, Claritin, or Store brand consistently for 2 weeks  2. For congestion you may try an oral decongestant like Mucinex or sudafed. You may also try intranasal flonase nasal spray or saline irrigations (neti pot, sinus cleanse)  3. For your sore throat you may try cepacol lozenges, salt water gargles, throat spray. Treatment of congestion may also help your sore throat.  4. For cough you may try Robitussen, Mucinex DM  5. Take Tylenol or Ibuprofen to help with pain/inflammation  6. Stay hydrated, drink plenty of fluids to keep throat coated and less irritated  Honey Tea For cough/sore throat try using a honey-based tea. Use 3 teaspoons of honey with juice squeezed from half lemon. Place shaved pieces of ginger into 1/2-1 cup of water and warm over stove top. Then mix the ingredients and repeat every 4 hours as  needed.   Your penile swab for herpes and syphilis test blood work are pending.  We will call if they are positive.  You have been prescribed valacyclovir that will treat genital herpes if it is present.     ED Prescriptions     Medication Sig Dispense Auth. Provider   valACYclovir (VALTREX) 1000 MG tablet Take 1 tablet (1,000 mg total) by mouth 2 (two) times daily for 7 days. 14 tablet [place], Acie Fredrickson, [place]      PDMP not reviewed this encounter.   Gustavus Bryant, [place] 11/02/21 1429

## 2021-11-02 NOTE — ED Triage Notes (Signed)
Pt c/o:   1) cough, headache, body aches  Denies sore throat, ear ache, chills, nausea, vomiting, diarrhea, constipation.   Onset Sunday   2) sti screening/skin tear. States if he is not lubricated when he penetrates the skin "tears back."   Denies discharging, burning, itching, frequency.   Asked to describe skin lesion states it is on the shaft, not the head, does not bleed, is not painful.   Onset 1-2 weeks ago.

## 2021-11-02 NOTE — Discharge Instructions (Signed)
You likely having a viral upper respiratory infection. We recommended symptom control. I expect your symptoms to start improving in the next 1-2 weeks.   1. Take a daily allergy pill/anti-histamine like Zyrtec, Claritin, or Store brand consistently for 2 weeks  2. For congestion you may try an oral decongestant like Mucinex or sudafed. You may also try intranasal flonase nasal spray or saline irrigations (neti pot, sinus cleanse)  3. For your sore throat you may try cepacol lozenges, salt water gargles, throat spray. Treatment of congestion may also help your sore throat.  4. For cough you may try Robitussen, Mucinex DM  5. Take Tylenol or Ibuprofen to help with pain/inflammation  6. Stay hydrated, drink plenty of fluids to keep throat coated and less irritated  Honey Tea For cough/sore throat try using a honey-based tea. Use 3 teaspoons of honey with juice squeezed from half lemon. Place shaved pieces of ginger into 1/2-1 cup of water and warm over stove top. Then mix the ingredients and repeat every 4 hours as needed.   Your penile swab for herpes and syphilis test blood work are pending.  We will call if they are positive.  You have been prescribed valacyclovir that will treat genital herpes if it is present.

## 2021-11-03 LAB — RPR: RPR Ser Ql: NONREACTIVE

## 2021-11-05 LAB — HSV CULTURE AND TYPING

## 2021-11-23 DIAGNOSIS — Z419 Encounter for procedure for purposes other than remedying health state, unspecified: Secondary | ICD-10-CM | POA: Diagnosis not present

## 2021-12-24 DIAGNOSIS — Z419 Encounter for procedure for purposes other than remedying health state, unspecified: Secondary | ICD-10-CM | POA: Diagnosis not present

## 2021-12-29 IMAGING — DX DG LUMBAR SPINE COMPLETE 4+V
5 series · 5 of 5 positions shown · non-contrast
Comparison: None.

CLINICAL DATA: Low back pain, no injury

EXAM:
LUMBAR SPINE - COMPLETE 4+ VIEW

[l-spine ap]
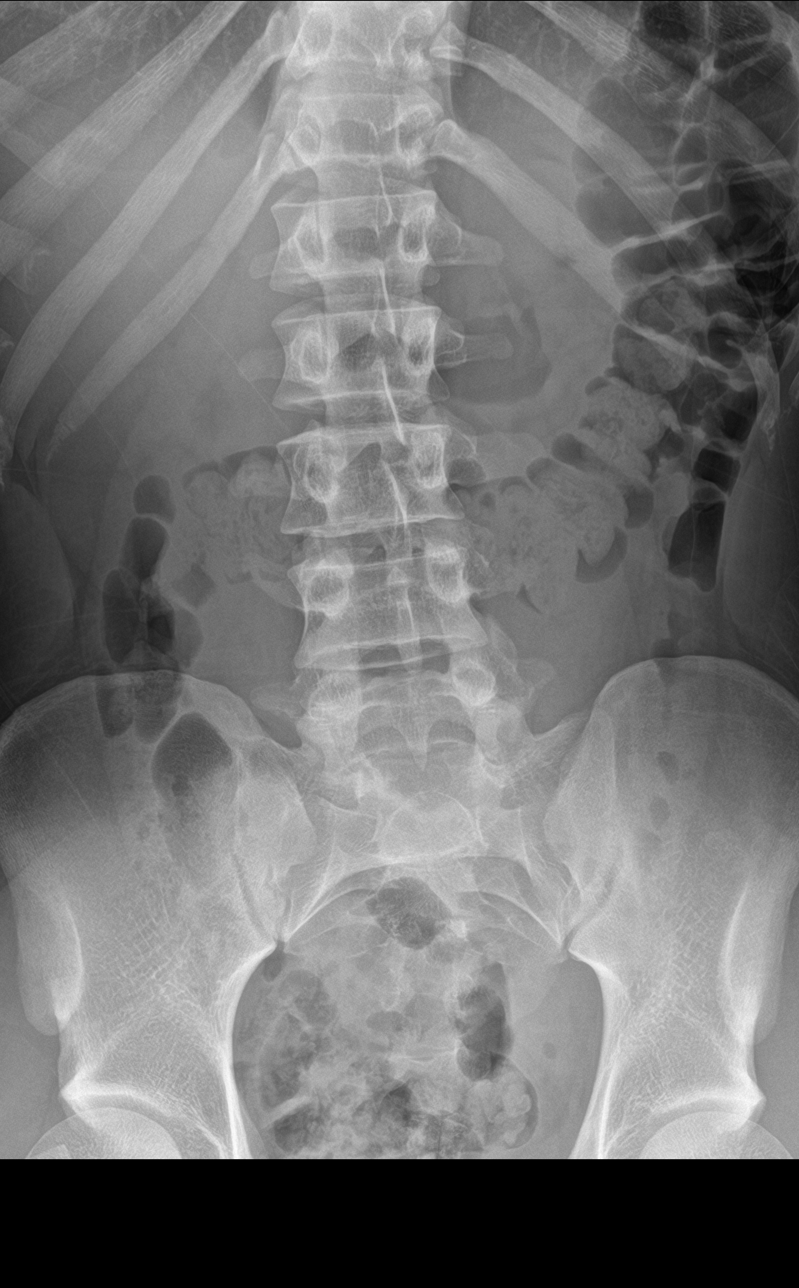

[l-spine obl (1 of 2)]
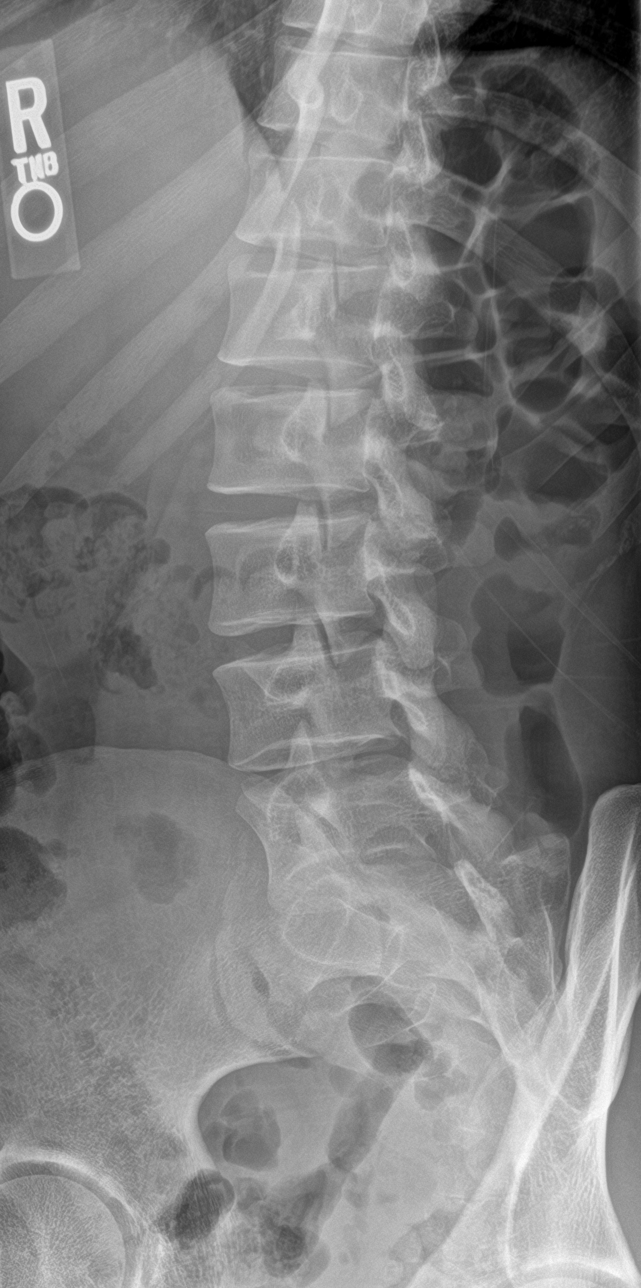

[l-spine obl (2 of 2)]
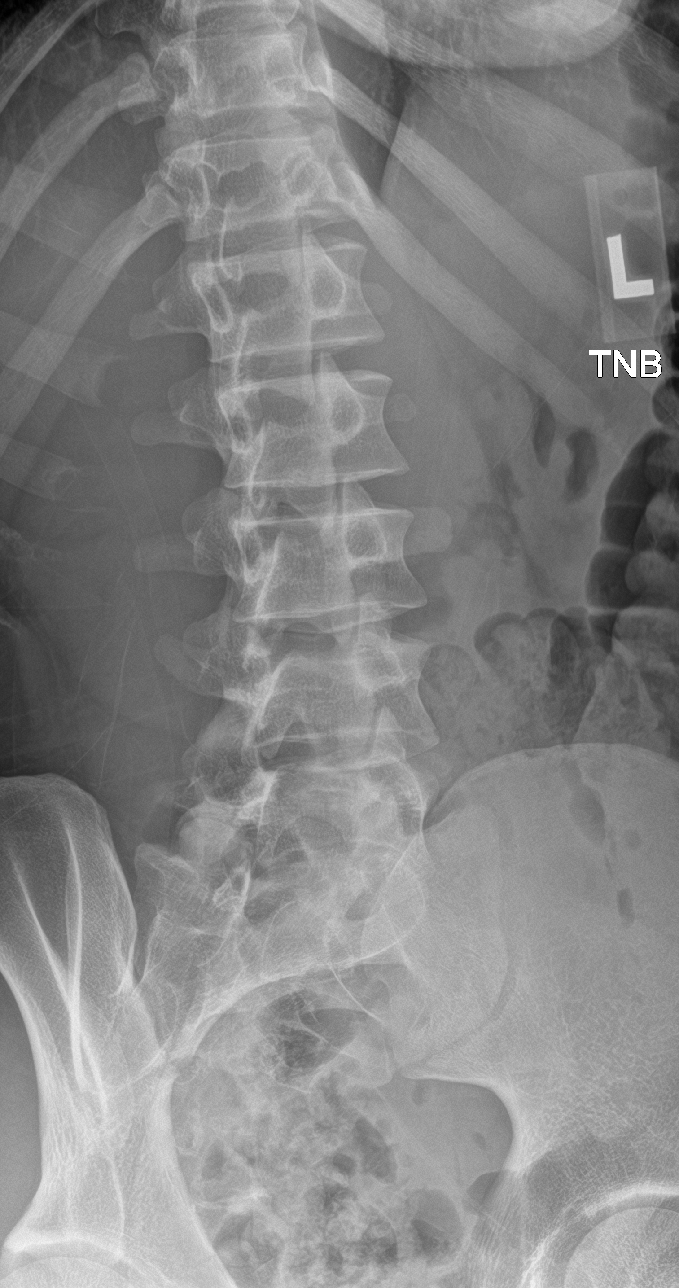

[l-spine lat]
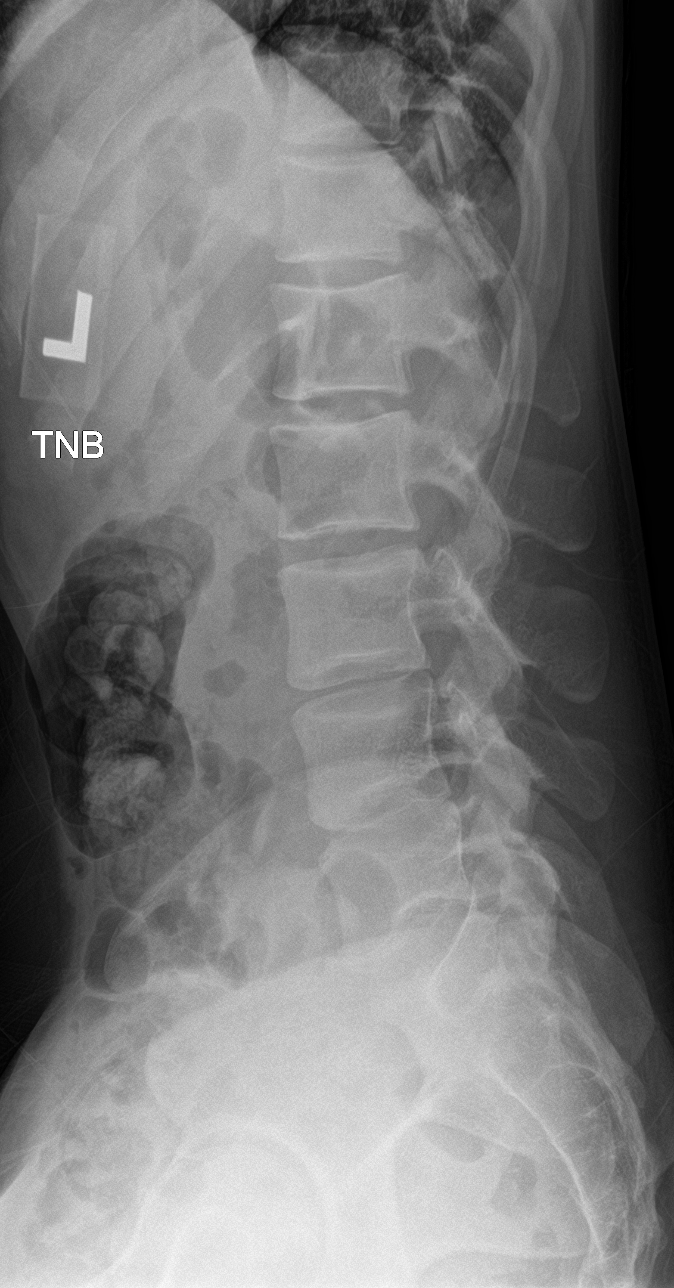

[l-spine spot]
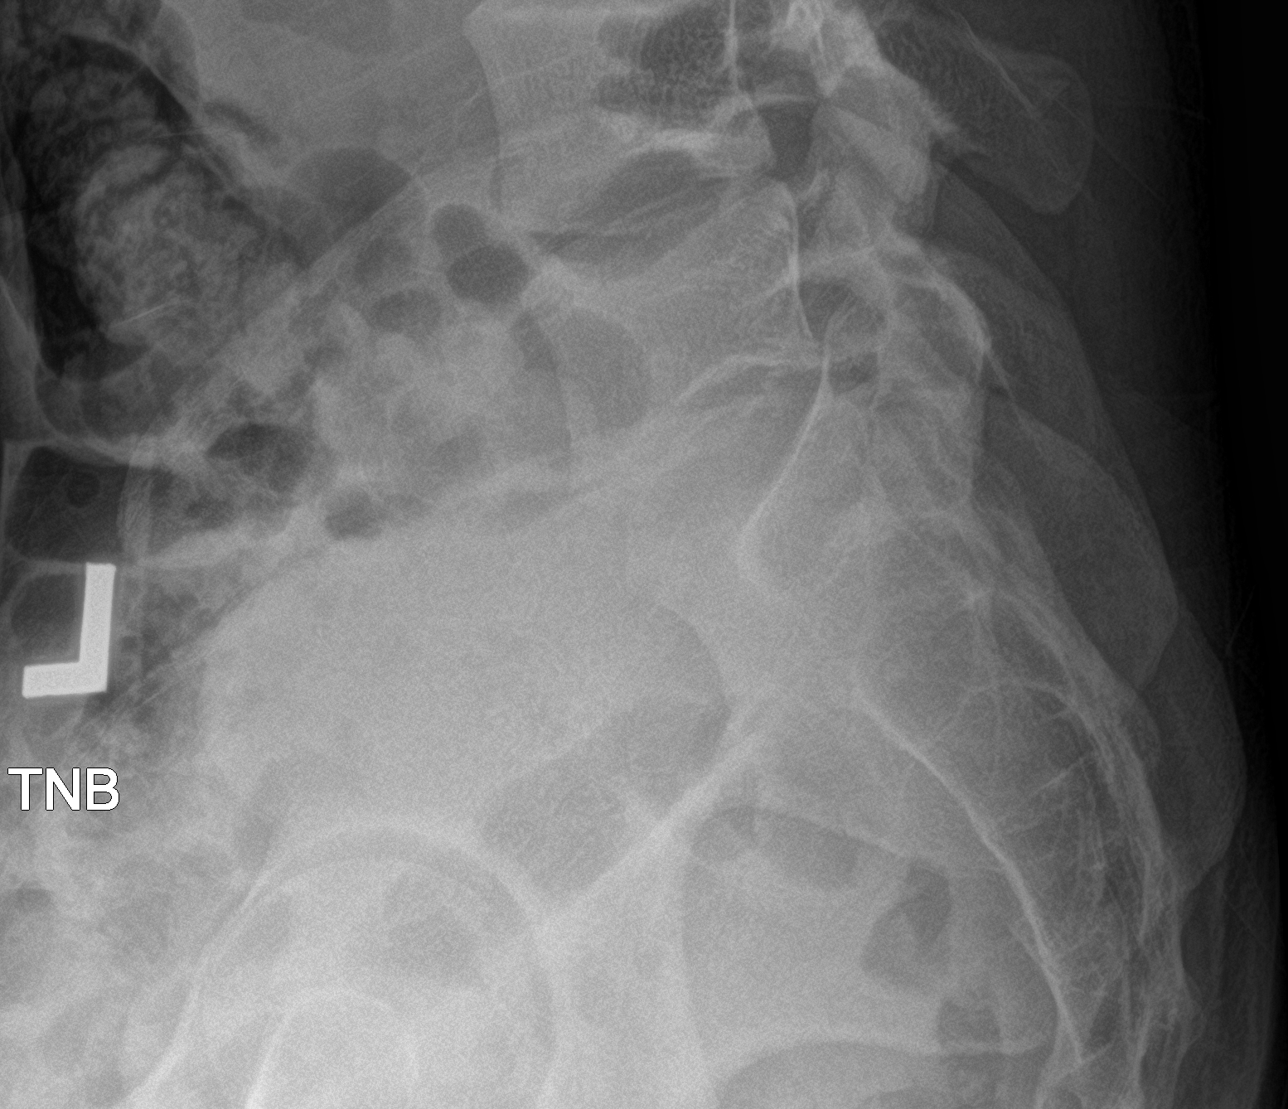

[5 of 5 positions shown; findings below may reference images not displayed]

FINDINGS: There is no evidence of lumbar spine fracture. Alignment is normal.
Intervertebral disc spaces are maintained.
IMPRESSION: Negative.

## 2022-01-09 ENCOUNTER — Emergency Department (HOSPITAL_COMMUNITY): Payer: No Typology Code available for payment source

## 2022-01-09 ENCOUNTER — Encounter (HOSPITAL_COMMUNITY): Payer: Self-pay

## 2022-01-09 ENCOUNTER — Other Ambulatory Visit: Payer: Self-pay

## 2022-01-09 ENCOUNTER — Emergency Department (HOSPITAL_COMMUNITY)
Admission: EM | Admit: 2022-01-09 | Discharge: 2022-01-09 | Disposition: A | Discharge status: Home / Self Care | Payer: No Typology Code available for payment source | Attending: Emergency Medicine | Admitting: Emergency Medicine

## 2022-01-09 DIAGNOSIS — Y92410 Unspecified street and highway as the place of occurrence of the external cause: Secondary | ICD-10-CM | POA: Insufficient documentation

## 2022-01-09 DIAGNOSIS — S0990XA Unspecified injury of head, initial encounter: Secondary | ICD-10-CM | POA: Insufficient documentation

## 2022-01-09 DIAGNOSIS — M25561 Pain in right knee: Secondary | ICD-10-CM | POA: Diagnosis not present

## 2022-01-09 DIAGNOSIS — S199XXA Unspecified injury of neck, initial encounter: Secondary | ICD-10-CM | POA: Diagnosis not present

## 2022-01-09 DIAGNOSIS — K029 Dental caries, unspecified: Secondary | ICD-10-CM | POA: Diagnosis not present

## 2022-01-09 DIAGNOSIS — S8992XA Unspecified injury of left lower leg, initial encounter: Secondary | ICD-10-CM | POA: Insufficient documentation

## 2022-01-09 DIAGNOSIS — R519 Headache, unspecified: Secondary | ICD-10-CM | POA: Diagnosis not present

## 2022-01-09 DIAGNOSIS — S0993XA Unspecified injury of face, initial encounter: Secondary | ICD-10-CM | POA: Diagnosis not present

## 2022-01-09 DIAGNOSIS — M542 Cervicalgia: Secondary | ICD-10-CM | POA: Diagnosis not present

## 2022-01-09 DIAGNOSIS — S8991XA Unspecified injury of right lower leg, initial encounter: Secondary | ICD-10-CM | POA: Diagnosis not present

## 2022-01-09 DIAGNOSIS — M7989 Other specified soft tissue disorders: Secondary | ICD-10-CM | POA: Diagnosis not present

## 2022-01-09 DIAGNOSIS — Y990 Civilian activity done for income or pay: Secondary | ICD-10-CM | POA: Insufficient documentation

## 2022-01-09 MED ORDER — IBUPROFEN 800 MG PO TABS
800.0000 mg | ORAL_TABLET | Freq: Once | ORAL | Status: AC
  Administered 2022-01-09: 800 mg via ORAL
  Filled 2022-01-09: qty 1
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
## 2022-01-09 NOTE — ED Notes (Signed)
Pt ambulatory without assistance.  

## 2022-01-09 NOTE — ED Notes (Signed)
I provided reinforced discharge education based off of discharge instructions. Pt acknowledged and understood my education. Pt had no further questions/concerns for provider/myself.  °
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
## 2022-01-10 ENCOUNTER — Telehealth: Payer: Self-pay

## 2022-01-10 NOTE — Telephone Encounter (Signed)
Transition Care Management Unsuccessful Follow-up Telephone Call  Date of discharge and from where:  01/09/2022 from [facility]  Attempts:  1st Attempt  Reason for unsuccessful TCM follow-up call:  Unable to leave message

## 2022-01-11 NOTE — Telephone Encounter (Signed)
Transition Care Management Unsuccessful Follow-up Telephone Call  Date of discharge and from where:  01/09/2022 from [facility]  Attempts:  2nd Attempt  Reason for unsuccessful TCM follow-up call:  Unable to leave message

## 2022-01-12 NOTE — Telephone Encounter (Signed)
Transition Care Management Unsuccessful Follow-up Telephone Call ° °Date of discharge and from where:  01/09/2022 from [facility] ° °Attempts:  3rd Attempt ° °Reason for unsuccessful TCM follow-up call:  Unable to reach patient ° ° ° °

## 2022-01-15 DIAGNOSIS — M545 Low back pain, unspecified: Secondary | ICD-10-CM | POA: Diagnosis not present

## 2022-01-15 DIAGNOSIS — M542 Cervicalgia: Secondary | ICD-10-CM | POA: Diagnosis not present

## 2022-01-15 DIAGNOSIS — S060X0A Concussion without loss of consciousness, initial encounter: Secondary | ICD-10-CM | POA: Diagnosis not present

## 2022-01-15 DIAGNOSIS — R6884 Jaw pain: Secondary | ICD-10-CM | POA: Diagnosis not present

## 2022-01-15 DIAGNOSIS — R519 Headache, unspecified: Secondary | ICD-10-CM | POA: Diagnosis not present

## 2022-01-24 DIAGNOSIS — Z419 Encounter for procedure for purposes other than remedying health state, unspecified: Secondary | ICD-10-CM | POA: Diagnosis not present

## 2022-02-21 DIAGNOSIS — Z419 Encounter for procedure for purposes other than remedying health state, unspecified: Secondary | ICD-10-CM | POA: Diagnosis not present

## 2022-03-24 DIAGNOSIS — Z419 Encounter for procedure for purposes other than remedying health state, unspecified: Secondary | ICD-10-CM | POA: Diagnosis not present

## 2022-04-23 DIAGNOSIS — Z419 Encounter for procedure for purposes other than remedying health state, unspecified: Secondary | ICD-10-CM | POA: Diagnosis not present

## 2022-05-24 DIAGNOSIS — Z419 Encounter for procedure for purposes other than remedying health state, unspecified: Secondary | ICD-10-CM | POA: Diagnosis not present

## 2023-12-25 DIAGNOSIS — Z419 Encounter for procedure for purposes other than remedying health state, unspecified: Secondary | ICD-10-CM | POA: Diagnosis not present

## 2024-01-25 DIAGNOSIS — Z419 Encounter for procedure for purposes other than remedying health state, unspecified: Secondary | ICD-10-CM | POA: Diagnosis not present

## 2024-02-22 DIAGNOSIS — Z419 Encounter for procedure for purposes other than remedying health state, unspecified: Secondary | ICD-10-CM | POA: Diagnosis not present

## 2024-04-04 DIAGNOSIS — Z419 Encounter for procedure for purposes other than remedying health state, unspecified: Secondary | ICD-10-CM | POA: Diagnosis not present

## 2024-05-04 DIAGNOSIS — Z419 Encounter for procedure for purposes other than remedying health state, unspecified: Secondary | ICD-10-CM | POA: Diagnosis not present

## 2024-06-04 DIAGNOSIS — Z419 Encounter for procedure for purposes other than remedying health state, unspecified: Secondary | ICD-10-CM | POA: Diagnosis not present

## 2024-07-04 DIAGNOSIS — Z419 Encounter for procedure for purposes other than remedying health state, unspecified: Secondary | ICD-10-CM | POA: Diagnosis not present

## 2024-08-04 DIAGNOSIS — Z419 Encounter for procedure for purposes other than remedying health state, unspecified: Secondary | ICD-10-CM | POA: Diagnosis not present

## 2024-09-04 DIAGNOSIS — Z419 Encounter for procedure for purposes other than remedying health state, unspecified: Secondary | ICD-10-CM | POA: Diagnosis not present

## 2024-10-14 DIAGNOSIS — L739 Follicular disorder, unspecified: Secondary | ICD-10-CM | POA: Diagnosis not present
